# Patient Record
Sex: Female | Born: 1945 | Race: White | Hispanic: No | Marital: Married | Smoking: Former smoker
Health system: Western US, Academic
[De-identification: ages and names within clinical notes are randomized; demographics above are authoritative.]

## PROBLEM LIST (undated history)

## (undated) DIAGNOSIS — M1990 Unspecified osteoarthritis, unspecified site: Secondary | ICD-10-CM

## (undated) DIAGNOSIS — F32A Depression, unspecified: Secondary | ICD-10-CM

## (undated) DIAGNOSIS — D649 Anemia, unspecified: Secondary | ICD-10-CM

## (undated) DIAGNOSIS — K219 Gastro-esophageal reflux disease without esophagitis: Secondary | ICD-10-CM

## (undated) DIAGNOSIS — E7800 Pure hypercholesterolemia, unspecified: Secondary | ICD-10-CM

## (undated) DIAGNOSIS — Z87442 Personal history of urinary calculi: Secondary | ICD-10-CM

## (undated) DIAGNOSIS — J189 Pneumonia, unspecified organism: Secondary | ICD-10-CM

## (undated) DIAGNOSIS — Z860100 Personal history of colon polyps, unspecified: Secondary | ICD-10-CM

## (undated) DIAGNOSIS — I10 Essential (primary) hypertension: Secondary | ICD-10-CM

## (undated) DIAGNOSIS — N390 Urinary tract infection, site not specified: Secondary | ICD-10-CM

## (undated) DIAGNOSIS — H9190 Unspecified hearing loss, unspecified ear: Secondary | ICD-10-CM

## (undated) HISTORY — PX: ABDOMINAL HYSTERECTOMY: SHX81

## (undated) HISTORY — DX: Unspecified osteoarthritis, unspecified site: M19.90

## (undated) HISTORY — DX: Pneumonia, unspecified organism: J18.9

## (undated) HISTORY — DX: Urinary tract infection, site not specified: N39.0

## (undated) HISTORY — DX: Depression, unspecified: F32.A

## (undated) HISTORY — DX: Personal history of colon polyps, unspecified: Z86.0100

## (undated) HISTORY — DX: Anemia, unspecified: D64.9

## (undated) HISTORY — DX: Gastro-esophageal reflux disease without esophagitis: K21.9

## (undated) HISTORY — DX: Personal history of urinary calculi: Z87.442

## (undated) HISTORY — PX: COCHLEAR IMPLANT: SUR684

## (undated) HISTORY — DX: Personal history of colonic polyps: Z86.010

---

## 1977-09-18 HISTORY — PX: ABDOMINAL HYSTERECTOMY: SHX81

## 2005-02-06 ENCOUNTER — Ambulatory Visit

## 2005-02-10 ENCOUNTER — Encounter: Admitting: Audiologist

## 2005-02-16 ENCOUNTER — Ambulatory Visit

## 2005-04-05 ENCOUNTER — Encounter: Admitting: Otolaryngology

## 2007-05-07 ENCOUNTER — Ambulatory Visit

## 2007-06-07 ENCOUNTER — Ambulatory Visit

## 2007-06-07 ENCOUNTER — Ambulatory Visit: Admitting: Audiologist

## 2007-06-11 DIAGNOSIS — H903 Sensorineural hearing loss, bilateral: Secondary | ICD-10-CM | POA: Insufficient documentation

## 2007-06-11 NOTE — Procedures (Signed)
COCHLEAR IMPLANT EVALUATION                              [name] was seen in the clinic today for an audiologic cochlear implant evaluation.  She was seen for cochlear implant evaluation in 2006 and did not meet the audiologic criteria at that time.  Her history is detailed in that report.  She returned today for repeat evaluation because she is having more communication difficulty.        Audiologic evaluation performed By Enedina Finner, M.A. on May 1 of this year showed moderate to profound bilateral sensorineural hearing loss.  There was a slight hearing decrease at 250 Hz. and 4000 Hz. in the right ear relative to her last evaluation.  The left ear was stable.  Pure tone averages were 81 dB for the right ear and 78 dB for the left.  Electroacoustic analysis of her Widex Senso P-38 digital hearing aids showed good function and appropriate settings.  Aided warble tone responses indicated improved acuity with amplification.  The pre-implant test battery revealed 83% word recognition ability on the HINT sentences presented at 70 dB SPL (57 dB HL) in the binaural condition, 77% with the right aid only and 68% with the left aid only.     [name]' results still do not meet the audiologic criteria for cochlear implantation.  I think she might be a good candidate for the hybrid CI/hearing aid device.  I will check on this for her and email her my findings.  We discussed the implant process and I showed her the internal and external equipment.  No follow up appointments were made but I will contact her when I find out more about the time frame for the hybrid.

## 2010-11-26 NOTE — Progress Notes (Addendum)
PATIENT: [name], [name]   MR #: [id number] SEX: F AGE: 65  DATE OF SERVICE: 02/10/2005 DOB: Jul 11, 1946    OTOLARYNGOLOGY CLINIC NOTE    COCHLEAR IMPLANT EVALUATION    [name] was seen in the audiology clinic today for cochlear implant evaluation. She is a 65 year old woman who has had progressive sensorineural hearing loss. 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 She received her first hearing aids in the second grade at age 14. Her speech production is excellent with good intelligibility, but has some characteristics of deaf speech. Her word recognition ability has decreased significantly in the last few years, so she was referred for a cochlear implant evaluation.    Electroacoustic analysis showed Ms. Dowler' Widex Senso P38 hearing aids to be working well. These are very appropriate hearing aids for her degree of loss. Aided warble tone responses indicated excellent improvement from amplification. The only frequencies where she did not have significant improvement were 3000 and 4000 Hz for the right ear. The pre-implant test battery revealed 92% word recognition ability on the HINT sentences presented at 50 dB HL. Speech reading ability was excellent at 98% with amplification and quite poor at 32% without amplification. This indicated that she recieves significant improvement from her hearing aids both in word recognition ability and in speech reading ability.    Ms. Joost' results do not meet the audiologic criteria for cochlear implantation. However, because of the progressive nature of her loss, I feel that at some point a cochlear implant will be an appropriate therapy for her. We discussed the implant process and I showed her the internal and external equipment. I also gave her some patient literature and video tapes to take home. I did not make a followup appointment in the otology clinic because she does not yet meet all the criteria. However, I  have encouraged her to contact me if she desires reevaluation and to see Dr. Dan Humphreys and Mr. Yisroel Ramming if she notices changes in her hearing.        THIS WAS ELECTRONICALLY SIGNED - 02/20/2005 11:27 AM PST BY: Marcelyn Ditty, MA  AUDIOLOGIST  DEPARTMENT OF OTOLARYNGOLOGY                  NF:[id number])    D: 02/17/2005 09:04 AM  T: 02/18/2005 10:51 AM  C#: [id number]    cc:   Fleet Contras, MA, [address], [address], [place], [place] [address]

## 2011-04-26 ENCOUNTER — Ambulatory Visit

## 2011-05-26 ENCOUNTER — Ambulatory Visit: Admitting: Audiologist

## 2011-05-26 ENCOUNTER — Ambulatory Visit

## 2011-05-28 DIAGNOSIS — H903 Sensorineural hearing loss, bilateral: Secondary | ICD-10-CM | POA: Insufficient documentation

## 2011-05-28 NOTE — Procedures (Signed)
COCHLEAR IMPLANT EVALUATION     [name] was seen in the clinic today for another audiologic cochlear implant evaluation. She has seen me for 2 previous cochlear implant evaluations, in 2006 and 2008.  We discussed enrolling in the hybrid study at [facility] but this didn't happen.  She reports some further deterioration in her hearing since the last evaluation. She and her husband are both reporting significantly decreased communication ability, especially in the last year.  She is using the captioned phone and has had her living room wired for use with the tele-coil.  She uses several accessories utilizing the tele-coil.  [name] indicated that her hearing loss might be congenital or may have happened in early childhood. She had [name] at age 36. Her hearing loss was not identified until age 38 and she obtained hearing aids shortly after the identification.    Audiologic evaluation performed on February 28, 2011 showed  moderate to profound sensorineural hearing loss in both ears.  The right ear had slightly poorer hearing in the left. There were no responses in the right ear above 2000 Hz. Pure tone averages were 80 dB for the right ear and 83 dB for the left.  Electroacoustic analysis of [name]' Widex MA-19 power BTE hearing aids showed good function and appropriate settings.  Aided warble tone responses indicated improved acuity with amplification.  The pre-implant test battery revealed 46% word recognition ability on the AZ-Bio sentences presented at 60 dB SPL in the binaural condition, 44% with the right aid only and 19% with the left aid only.  The score for CNC words were 36% correct.  The BKB SIN score was 8.5 dB.        [name]' results now meet the audiologic criteria for cochlear implantation.  The right ear would be a better choice because of patient preference.  We discussed this in some detail because the AZ-Bio score was higher in the right ear than the left.  However, she has never  noticed any advantage to either ear and uses the phone in the left ear.  She gets little information from the phone but reports no ability to use the phone on the right.  This ear has no response in the higher frequencies and the left still has measureable hearing out to 8000 Hz.  The choice of ear will also be dependent on the imaging studies.      We discussed the implant process and I showed [name] and her husband the internal and external components for the Cochlear, AB and Med-El devices.  These systems were discussed in detail.  [name] was told that successful implant use will require time and commitment to device programming and rehabilitation.  She has more residual hearing than some implant patients so she is likely to notice less improvement initially than a patient with no ability to understand speech.  After our discussion it was decided that would be best served with the Cochlear device, primarily because of the ability to set the telecoil to come on automatically.  The processor color should be white and the coil color should be brown. The patient literature and DVD for the Cochlear Freedom implant/N-5 processor were given to [name] to take home.  Follow up appointment will be scheduled with Dr. Ralph Leyden.  She will contact our scheduling coordinator to arrange this.       Report Electronically Signed By:    Name:   Marcelyn Ditty, M.A.  Training Level:  Scientist, research (medical)  Department:  ENT  Phone #:  [phone]

## 2011-06-08 ENCOUNTER — Ambulatory Visit: Admitting: Otolaryngology

## 2011-06-08 VITALS — Temp 98.9°F | Ht 63.5 in | Wt 163.8 lb

## 2011-06-08 NOTE — Nursing Note (Signed)
>>   [facility] HARPER     Thu Jun 08, 2011  2:11 PM  Pt. Roomed, name & DOB verified.  Allergies, meds & pain reviewed. MD.use of Binocular scope set up and clean up done.M.HARPER,Sr.LVN

## 2011-06-08 NOTE — Progress Notes (Signed)
Followup bilateral sensorineural hearing loss.  Meets audiological criteria for cochlear implantation.  Right is the subjectively worse hearing ear so she wants right cochlear implant.    Plan:  Non-contrast CT temporal bones.  NO contrast.  Followup after CT.  White processor, white coil.

## 2011-06-22 ENCOUNTER — Ambulatory Visit

## 2011-06-30 ENCOUNTER — Ambulatory Visit

## 2011-07-07 ENCOUNTER — Ambulatory Visit: Admitting: Specialist

## 2011-07-07 VITALS — Temp 98.7°F | Ht 63.5 in | Wt 161.2 lb

## 2011-07-07 NOTE — Progress Notes (Signed)
ID: 65 y/o female with bilateral SNHL who was seen in consultation for a right sided cochlear implant with Dr. Ralph Leyden.  She was ordered a CT scan of the temporal bones and recommended to follow up afterwards to ensure she is a good surgical candidate.  She presents today in follow up and notes she has been doing well.  Denies any hearing loss or evidence of infections.  So is soo excited and eager for her implant.    Objective:  Gen: WDWN elderly female found sitting in the exam chair in NAD, breathing comfortably on RA  Head: NC/AT  Eyes: PERRL,EOMI  Ears: hearing aids bilaterally, EAC clear, TM intact and mobile to pneumatic otosocpy    CT temporal bones: normal, basal turn of cochlea patent, facial nerve covered    ASSESSMENT AND PLAN:  65 y/o female with bilateral SNHL   -place order for cochlear implant today  -will f/u at preop    DR. Ralph Leyden was the Otolaryngology attending supervising this patient's visit.

## 2011-07-07 NOTE — Nursing Note (Signed)
>>   [facility] HARPER     Fri Jul 07, 2011  2:06 PM  Pt. Roomed, name & DOB verified.  Allergies,Height, Weight, Temp checked, meds & pain reviewed. M.HARPER,Sr.LVN

## 2011-07-08 NOTE — Progress Notes (Signed)
The patient was seen by the resident.  I reviewed and agree with the resident's findings and plan as outlined in the resident's note above.     [name] [name]

## 2011-07-19 ENCOUNTER — Encounter: Payer: Self-pay | Admitting: Otolaryngology

## 2011-08-25 ENCOUNTER — Ambulatory Visit: Admitting: Otolaryngology

## 2011-08-25 ENCOUNTER — Ambulatory Visit

## 2011-08-25 VITALS — BP 142/78 | HR 72 | Temp 98.4°F | Resp 18 | Ht 63.5 in | Wt 160.9 lb

## 2011-08-25 NOTE — Progress Notes (Signed)
Patient roomed, medication reconciled, appropriate vitals obtained. Pre operative information explained in detail regarding NPO stauts, medication, and check in prior to surgery. All questions answered. Patient verbalized understanding. Appropriate patient signatures obtained.Satin Boal [place], Sr. LVN

## 2011-08-25 NOTE — Nursing Note (Signed)
>>   Delphina Cahill, Extern     Fri Aug 25, 2011 10:36 AM  Pre Op EKG performed. Verlon Au Tech ROP Extern

## 2011-08-25 NOTE — Progress Notes (Signed)
PATIENT: [name]   MR #: [id number] SEX: female  AGE: 54yr  DATE OF SERVICE: 08/25/2011 DOB: 1946-05-08    INPATIENT PRE-OPERATIVE HISTORY AND PHYSICAL    CHIEF COMPLAINT: Bilateral SNHL    HPI:    [name] is a 73yr year-old female who comes to clinic today for preoperative history and physical examination prior to their scheduled procedure on 09/02/11, with Dr. Ralph Leyden.  Procedure planned is RIGHT cochlear implant.    Patient was previously seen by Dr. Leilani Able on 07/07/11 and evaluated to have severe bilateral SNHL and meet criteria for cochlear implantation.  Per patient report,  Her symptoms remain unchanged today.     The patient reports feeling in normal state of health without cold/flu-like symptoms.       PAST MEDICAL HISTORY:  No past medical history on file.    PAST SURGICAL HISTORY:   No past surgical history on file.    MEDICATIONS:   Reviewed today, see EMR for full list.    ALLERGIES:   Allergies   Allergen Reactions    Keflex (Cephalexin) Rash    Sulfa(Sulfonamide Antibiotics) Rash       PCP:  Edmonia Caprio    SOCIAL HISTORY:  History     Social History    Marital Status: MARRIED     Spouse Name: N/A     Number of Children: N/A    Years of Education: N/A     Social History Main Topics    Smoking status: Not on file    Smokeless tobacco: Not on file    Alcohol Use: Not on file    Drug Use: Not on file    Sexually Active: Not on file     Other Topics Concern    Not on file     Social History Narrative    No narrative on file       FAMILY HISTORY:  Noncontributory  No family history on file.    REVIEW OF SYSTEMS:    Patient questionnaire was filled out in the office today - please see scanned form for positive findings. Patient with a total score of 1.    PHYSICAL EXAM:    GENERAL APPEARANCE: Patient is a well-nourished, well-developed female appearing stated age in no acute distress.    EYES: Equal, round and reactive to light. Extraocular muscles were  intact bilaterally.    EARS: EACs bilaterally were clear and widely patent with normal appearing TMs.  Hearing aids in place.    NASAL CAVITY: There are no lesions anteriorly. No evidence of rhinorrhea.    ORAL CAVITY:  Patient with good dentition. Mucosa without lesions. Uvula is at midline. No oropharyngeal asymmetry is noticed.     NECK: There is no evidence of lymphadenopathy, soft and supple.    LUNGS: Clear to auscultation bilaterally.    HEART: Regular rate and rhythm with no murmurs auscultated.    ABDOMEN: Soft, non-tender, non-distended. No palpable masses    EXTREMITY: No edema, warm and well-perfused    NEURO: No focal deficits/abnormalities noted.    ASSESSMENT   [name] is a 3yr year-old female with bilateral SNHL - ready for above mentioned scheduled procedure.    PLAN:  We will proceed with procedure as scheduled. Risks of procedure were explained to patient as the following, but not limited to: Pain, bleeding, infection, need for further procedures, risks of anesthesia (stroke, MI, death), damage to facial nerve, loss of hearing, need for removal  of implant.  After alternatives were also discussed, consent was obtained from patient and placed in patient chart today. Patient given all instructions regarding NPO status, where to report, length of surgery planned, when to expect any pathology results, expected post-operative course. Patient expressed understanding to all instructions and had opportunity to have all questions answered.    Patient will not be sent for a pre-anesthesia evaluation.     Dr. Ralph Leyden is the supervising attending for this visit, and was available for consultation throughout.    Electronically signed by:    Mikeal Hawthorne, MD PGY-2  Dept. of Otolaryngology - Head & Neck Surgery  Pager#. [phone]  Service Pager#. [phone]  PI#. [phone]

## 2011-08-25 NOTE — Patient Instructions (Signed)
"Together we pledge compassionate care with exceptional service."    PREOPERATIVE INSTRUCTIONS AND INFORMATION     If there are any changes in your health or questions concerning your admission or surgery, please call      Doctor: Ralph Leyden At: ENT Clinic Phone: [phone]     Date of Surgery: 09/02/11   Post-op Appointment:  09/08/11   Post- op Appointment Time: 9:00       INSTRUCTIONS FOR DAY OF SURGERY/PROCEDURE:  1. Arrival time is 2 hours prior to the scheduled start time, which could be as early as 5:30 a.m.   2. Please follow the Fasting (NPO) Guidelines (see below)  3. We care about your safety.  Please make arrangements to have a responsible adult with you when you leave the hospital. You must also arrange for car transportation (walking, bicycles, taxi and buses are prohibited). Your reflexes and judgment may be impaired for 18-48 hours after your surgery; therefore we cannot allow you to take responsibility for transporting yourself.  You should not drive, use power tools, make important decisions or drink alcohol for at least 24 hours after your operation.  UNFORTUNATELY, YOUR   SURGERY/ PROCEDURE WILL BE CANCELLED IF YOU DO NOT MAKE THESE ARRANGMENTS.   4. DO NOT smoke.  Smoking damages the protective mechanisms in the lung, causes build up of stomach juices, and heightens dangerous airway reflexes.  Stop smoking as far in advance as possible.  Smoking is not allowed on hospital grounds.  5. DO NOT drink alcohol or use illicit drugs 24 hours prior to your surgery.  Inform your surgeon if you use any of these substances.  6. Please take a bath or shower the morning of your procedure, as this is important to reduce skin bacteria.  7. DO NOT apply cosmetics, creams or lotions, powders, gels or perfumes after bathing.  8. DO NOT bring valuables (computers, cell phones, etc.) or jewelry (including your wedding ring) with you on the day of surgery.  Please REMOVE ALL PIERCING  JEWELRY PRIOR TO YOUR ARRIVAL.  9. It is best to wear comfortable, loose fitting clothing.  10. Bring some form of picture ID with you.    ADDITIONAL INSTRUCTIONS:  1. Please bring your CPAP machine with you if you use one.  2. Please follow the bowel prep instructions if this procedure is ordered by your surgeon.  UNFORTUNATELY, YOUR SURGERY WILL BE CANCELLED IF THIS IS NOT DONE.  3.   4.     FASTING (NPO) Guidelines - NOTHING BY MOUTH OR TUBE FEEDING (ENTERAL FEEDING)    FOR ADULTS:   NO solid food, dairy products, jello, broths, chewing gum, hard candy, lozenges or mints 8 hours prior to arrival     On the morning of Surgery/Procedure:  You may have sips of clear liquids up to 2 hours prior to arrival  o  Clear liquids include: water, clear fruit juice (no pulp), carbonated beverages and black coffee or tea without cream or milk, no sugar or sweeteners      No alcohol containing beverages       MEDICATIONS     Do not take NSAIDS (Ibuprofen, Motrin, Advil, Nuprin, Naprosyn), vitamins or herbal supplements 5 days prior to your surgery date without clearance from your surgeon.      Do not take aspirin products 10-14 days prior to your surgery without clearance by your surgeon.      On the morning of your surgery, DO NOT take the following medications:  Potassium Chloride (MICRO-K) 10 mEq capsule      Triamterene 37.5 mg/Hydrochlorothiazide 25mg  (DYAZIDE) 37.5-25 mg per capsule             No outpatient prescriptions have been marked as taking for the 08/25/11 encounter (Office Visit) with Mikeal Hawthorne.       YOUR SURGERY AND CONTACT INFORMATION      [facility], [address], [place], [place] [address]   The final surgery schedule is available 24 hours prior to your scheduled surgery date.  The [facility] Pre-op Coordinator will contact you the day before your surgery between the hours of 12 noon and 6 p.m. (Monday to Friday) with instructions regarding your arrival time, where to check in and  additional information you will need for your surgery.  You may call the [facility] Surgery line at [phone], the day before your surgery if you have not received information.    If you reach our voice mail, please leave your name, your medical record number, your surgery date and a telephone number where we may reach you, and we will return your call.  IF YOU MUST CANCEL YOUR SURGERY OUTSIDE NORMAL BUSINESS HOURS ON THE DAY BEFORE OR THE DAY OF THE SCHEDULED SURGERY, PLEASE CALL THE [facility] LINE AS SOON AS POSSIBLE AT [phone].    THANK YOU FOR CHOOSING [facility].  IT IS OUR PRIVILEGE TO CARE FOR YOU!

## 2011-08-25 NOTE — Progress Notes (Signed)
The patient was seen by the resident.  I reviewed and agree with the resident's findings and plan as outlined in the resident's note above.     [name]

## 2011-09-01 NOTE — Nurse Focus (Signed)
Requested ASL interpreter for preop in PACU2 for 09/02/2011 at 0845/ confirmed by Clydie Braun MIS services. Requested ASL interpreter for postop at approx 1230pm 09/02/11, confirmed by Clydie Braun MIS services.

## 2011-09-02 ENCOUNTER — Ambulatory Visit: Admit: 2011-09-02 | Admitting: Otolaryngology

## 2011-09-02 MED ORDER — ONDANSETRON HCL (PF) 4 MG/2 ML INJECTION SOLUTION
4.0000 mg | INTRAMUSCULAR | Status: DC | PRN
Start: 2011-09-02 — End: 2011-09-02

## 2011-09-02 MED ORDER — REMOVE SCOPOLAMINE PATCH
Freq: Once | Status: DC
Start: 2011-09-05 — End: 2011-09-02

## 2011-09-02 MED ORDER — FENTANYL (PF) 50 MCG/ML INJECTION SOLUTION
25.0000 ug | INTRAMUSCULAR | Status: DC | PRN
Start: 2011-09-02 — End: 2011-09-02

## 2011-09-02 MED ORDER — HYDROMORPHONE (PF) 1 MG/ML INJECTION SYRINGE
0.2000 mg | INJECTION | INTRAMUSCULAR | Status: DC | PRN
Start: 2011-09-02 — End: 2011-09-02

## 2011-09-02 MED ORDER — METOCLOPRAMIDE 5 MG/ML INJECTION SOLUTION
10.0000 mg | INTRAMUSCULAR | Status: DC | PRN
Start: 2011-09-02 — End: 2011-09-02

## 2011-09-02 MED ORDER — HYDROCODONE 5 MG-ACETAMINOPHEN 325 MG TABLET
1.0000 | ORAL_TABLET | ORAL | Status: DC | PRN
Start: 2011-09-02 — End: 2011-09-02

## 2011-09-02 MED ORDER — HYDROCODONE 5 MG-ACETAMINOPHEN 325 MG TABLET
1.0000 | ORAL_TABLET | ORAL | Status: AC | PRN
Start: 2011-09-02 — End: 2011-09-09

## 2011-09-02 MED ORDER — LACTATED RINGERS IV INFUSION
INTRAVENOUS | Status: DC
Start: 2011-09-02 — End: 2011-09-02

## 2011-09-02 MED ORDER — LIDOCAINE HCL 10 MG/ML (1 %) INJECTION SOLUTION
0.1000 mL | INTRAMUSCULAR | Status: DC | PRN
Start: 2011-09-02 — End: 2011-09-02
  Administered 2011-09-02: 0.2 mL

## 2011-09-02 MED ORDER — SCOPOLAMINE 1 MG OVER 3 DAYS TRANSDERMAL PATCH
1.0000 | MEDICATED_PATCH | Freq: Once | TRANSDERMAL | Status: AC
Start: 2011-09-02 — End: 2011-09-02
  Administered 2011-09-02: 1 via TRANSDERMAL
  Filled 2011-09-02: qty 1

## 2011-09-02 MED ORDER — LACTATED RINGERS IV INFUSION
INTRAVENOUS | Status: DC
Start: 2011-09-02 — End: 2011-09-02
  Administered 2011-09-02: 09:00:00 via INTRAVENOUS

## 2011-09-02 NOTE — Progress Notes (Addendum)
ANESTHESIOLOGY PACU NOTE  Date: 09/02/2011 Time: 3:35 PM    Date of Service (Patient contact): 09/02/2011    Patient ID & Procedure/Indication:   [name] is a 65 year old female s/p cochlear implant under general.  Doing very well, no pain. Denies any N/V. No chest pain or SOB.     Temp: 36.6 C (97.9 F) (12/15 1445)  Temp src: Core (12/15 1445)  Pulse: 85  (12/15 1445)  BP: 118/55 mmHg (12/15 1445)  Resp: 22  (12/15 1445)  SpO2: 100 % (12/15 1445)  Height: 162.6 cm (5\' 4" ) (12/15 0842)  Weight: 72.7 kg (160 lb 4.4 oz) (12/15 0842)       Neuro : A/O x 3, MAE X 4   Cardio: Reg rate and rhythm, no murmurs  Lungs: CTAB   Abd: Soft, WNL  Dressings: C/D/I     Okay to D/C from PACU to home.     Report Completed by:   Fransisca Connors  Department of Anesthesiology and Pain Medicine  Resident Physician, PGY-4  Pager#: [phone]  PI#: [phone]    Pt was seen by me in PACU and I agreed to the above plan.  Electronically signed by:  Kinnie Scales. Sheppard Penton, M.D.  Attending Anesthesiologist  P.I. [phone]  Pager [phone]

## 2011-09-02 NOTE — Nurse Focus (Signed)
PACU ADMIT NURSING NOTE    Note Started: 09/02/2011, 2:49 PM     Received patient from OR at 1435 hours via gurney.  Monitor and Alarms on.  Patient sleepy but arousable. Ear patch to right side intact. Adele Schilder

## 2011-09-02 NOTE — Progress Notes (Signed)
ANESTHESIOLOGY ATTENDING NOTE  Date: 09/02/2011 Time: 2:44 PM      Date of service (Patient contact): *09/02/2011    Procedure: Right cochlear implant    Anesthesia: General    Perioperative Presence: CRNA provider, I provided direction.    Concurrency: TWO-FOUR CONCURRENT CASES:  I was physically present for the key portion(s) of the procedure and during other times, was immediately available to return to the procedure.  The key portions of the procedure are documented below.  During the time in which my physical presence was not required, a designated backup teaching anesthesiologist was immediately available.     Name of Backup Anesthesiologist: Regino Schultze, M.D.    Key Portions:  Induction, positioning, emergence and transport to PACU.                              The information contained on this form is true and correct to the best of my knowledge. Further, I understand that if I misrepresent, falsify or conceal information regarding my participation in the professional service described above, I may be subject to fine, imprisonment, or civil penalty under applicable federal laws.

## 2011-09-02 NOTE — Progress Notes (Addendum)
ANESTHESIA PRE OP ASSESSMENT  Date: 09/02/2011 Time: 10:07 AM   Date of Service (Patient contact): 09/02/2011  Patient Name: [name]  14yr  Mar 22, 1946    Consent form completed and signed by the patient: yes   Scheduled Surgery Date: 09/02/2011  Proposed Surgery:  Right Cochlear Implant  Pre-op Dx: Otosclerosis    HISTORY  Patient Active Problem List   Diagnoses Date Noted    Sensorineural hearing loss, bilateral 05/28/2011    Sensory Hearing Loss, Bilateral 06/11/2007    Allergies   Allergen Reactions    Keflex (Cephalexin) Rash    Sulfa(Sulfonamide Antibiotics) Rash      No past medical history on file.  No past surgical history on file. Prescriptions prior to admission   Medication    Estradiol (ESTRACE) 1 mg Tablet    MULTIVITAMIN WITH MINERALS (MULTI-VITAMIN W/MINERALS PO)    Potassium Chloride (MICRO-K) 10 mEq capsule    Pravastatin (PRAVACHOL) 20 mg Tablet    Triamterene 37.5 mg/Hydrochlorothiazide 25mg  (DYAZIDE) 37.5-25 mg per capsule    Vit A,C & E-Niac-B2-Lut-Mn-Glu (EYE-VITE) 1000-300-100-2 unit-mg-unit-mg Tab      Social History     Occupational History    Not on file.     Social History Main Topics    Smoking status: Former Smoker -- 0.5 packs/day for 10 years     Types: Cigarettes     Quit date: 09/01/1982    Smokeless tobacco: Never Used    Alcohol Use: 0.5 oz/week     1 Glasses of wine per week      occasional    Drug Use: No    Sexually Active: Not on file    Active Inpatient Medications     Lactated Ringers, , IV, CONTINUOUS, Last Rate: 20 mL/hr at 09/02/11 0908         Lidocaine (XYLOCAINE) 10 mg/mL (1 %) 0.1-0.25 mL, INFILTRATION, PRN        No family history on file.     Anesthetic Hx:  general anesthesia, denies complications             CV Tests:  EKG    Patient Prescribed a Beta Blocker? No    Labs:  Lab Results   Lab Name Value Date/Time    WHITE BLOOD CELL COUNT 5.1 08/25/2011 0923    HEMOGLOBIN 15.4 08/25/2011 0923    HEMATOCRIT 45.1 08/25/2011 0923    PLATELET COUNT  274 08/25/2011 0923     Lab Results   Lab Name Value Date/Time    SODIUM 136 08/25/2011 0923    POTASSIUM 3.6 08/25/2011 0923    CHLORIDE 101 08/25/2011 0923    CARBON DIOXIDE TOTAL 27 08/25/2011 0923    UREA NITROGEN, BLOOD (BUN) 10 08/25/2011 0923    CREATININE BLOOD 0.56 08/25/2011 0923    GLUCOSE 90 08/25/2011 0923     No results found for this basename: INR, APTT, PT     No results found for this basename: POCPREG, PREGURINE       ROS:  CV:  HTN, hyperlipidemia, Denies C/P, Chest pressure, SOB, DOE, Syncope, Presyncope, Palpitations, Orthopnea, PND  Resp:  None  Neuro: None  Musculoskeletal:  None  Med:  None    Activity:  2 flights stairs, walk>1 mile    VITAL SIGNS:  Vital Signs (Last Recorded):  Temp: 36.8 C (98.2 F) (09/02/11 0842)     Pulse: 74   BP: 149/60 mmHg  Resp: 16   SpO2: 100 %  Height: 162.6 cm (  5\' 4" )  Weight: 72.7 kg (160 lb 4.4 oz)  Body surface area is 1.81 meters squared.  Body mass index is 27.51 kg/(m^2).    PE:  Mallampati image     Mallampati Class:  1  Oral Eval: Mouth opening normal  Neck ROM:  full  Thyroid-mentum distance in fingerbreaths: 3  Lungs:  normal  Card:  regular    ASA Status: 2 - Mild, controlled systemic disease and no functional limitations    NPO Guidelines Met: yes    Consent:  Risks and benefits of General and Regional Anesthesia discussed with patient.  Questions answered and patient wishes to proceed.    Impression and Anesthetic Plan:   GETA    Lurena Nida, CRNA, CRNA     This patient was seen, evaluated, and care plan was developed with the CRNA.  I agree with the assessment and plan as outlined in her note.  ASA II for GA ETT and field avoidance.  Report electronically signed by Grayling Congress, MD, MD. Attending

## 2011-09-02 NOTE — Procedures (Signed)
PATIENTCARYLE, [name]  MR #:  [id number]  DOB:  14-Feb-1946  SEX:  F  AGE:  64  OPERATION DATE:  09/02/2011    INPATIENT OPERATION RECORD    PREOPERATIVE DIAGNOSIS:    Sensorineural deafness.    POSTOPERATIVE DIAGNOSIS:    Sensorineural deafness.    PROCEDURE PERFORMED:    Right cochlear implant, operating microscope, facial nerve monitoring  x 2 hours.    TYPE OF ANESTHESIA:    General.    DVT PROPHYLAXIS:    None.    FINDINGS:    Full insertion of Nucleus Freedom.    SPECIMENS REMOVED:    None.    ESTIMATED BLOOD LOSS:    25 ml.    DRAINS:    None.    FLUIDS:    See Anesthesia record.    COMPLICATIONS:    None.    PROBLEM:    Stable to Recovery.    PROCEDURE IN DETAIL:    The patient was brought to the Operating Room and general anesthesia  was induced.  A time-out was performed to correctly identify the  patient and procedure.  Once all parties were agreement, the patient  was turned 180 degrees away from anesthesiologist.  Facial nerve  monitoring electrodes were placed in the orbicularis oris and  orbicularis oculi.  Tap tests performed to confirm proper placement.  The facial nerve monitor was used for neuromonitoring throughout the  duration of the case without adverse event.  We then prepped and  draped the patient in the usual sterile fashion.    We began with injecting 1% lidocaine with epinephrine into the planned  surgical incision site.  We created a postauricular incision down to  the temporalis fascia.  We then elevated a separate periosteal Palva  flap and retracted this forward.  We brought the operating microscope  into the field and used this for microdissection throughout the  duration of the case.    Using sequential smaller cutting burs, we performed a standard  mastoidectomy.  The horizontal semicircular canal and incus were  identified.  We then used this to help [place] identify the facial recess.  This was opened and we were able to evaluate the middle ear.  We  identified the round window and  anterior superior to this we created  our cochleostomy.  Once we did this, we placed a cotton ball in the  mastoid and turned our attention to creating our well.  This was done  posterosuperior to our mastoidectomy using a large cutting bur.  Once  a sufficient well was created, we turned our attention back to the  mastoid cavity and removed the cotton ball.  We opened the Nucleus  Freedom device and placed it in the well.  The ground was placed in  the temporalis muscle.  We then inserted the electrode into the  cochleostomy and achieved full insertion.  A small piece of fascia was  taken to cover our cochleostomy opening.    We then proceeded with closure.  The periosteum was reapproximated to  fully cover the device using interrupted 3-0 Vicryl suture.  The  subcutaneous tissue was then also closed using interrupted 3-0 Vicryl  suture.  Steri-Strips were placed over the incision and a Glasscock  dressing was applied.  We then turned the patient over to Anesthesia  for recovery.    POSTOPERATIVE PLAN:    The patient will be discharged home with pain medication.  She will  follow up  in one week for a wound check.    NAME OF SURGEONS AND ASSISTANTS:    Surgeon:  Aquilla Hacker, MD, PhD  Assistant Surgeon:  Arletta Bale, MD        Report Electronically Signed - 09/03/2011 13:03:02 by  Arletta Bale, MD  Resident  Otolaryngology    Report Electronically Signed - 09/05/2011 07:56:30 by  Aquilla Hacker, MD, PhD  Professor  Otolaryngology    [facility]  D:  09/02/2011 19:08:26 PDT/PST  T:  09/02/2011 22:40:06 PDT/PST  Job #:  [id number] / [id number]

## 2011-09-02 NOTE — Patient Instructions (Addendum)
MEDICATION:  1. Take your pain medication as needed.    ACTIVITY:  1. Resume regular activity.  However, avoid strenuous activity or lifting objects greater than 10 pounds.    EAR:  1. You can take off hard plastic dressing and remove the cotton ball from your ear after 1 day.  2. Do not get your ear wet.    FOLLOW-UP:  1. Call our clinic to make your follow up appointments.  You should make one at 1 week after discharge, and another at 1 month after discharge.  Call [phone] as soon as you can to make your appointment.      General Anesthesia -  After surgery, things you need to know:    1. It is normal to feel dizzy and sleepy for many hours after your surgery.  Do not drive a car, work any equipment, sign important papers, or make big decisions until the next day.  2. Diet:  Begin with clear liquids (apple juice, clear broth, ginger ale) and progress slowly to what you normally eat.  No alcoholic drinks for 24 hours.  3. Activities:  Rest the day of surgery.  The day after surgery , you may do whatever you feel able to do, unless otherwise directed by your physician.  You should be able to do all of your normal activities within 3-4 days.  4. Temperature:  Report any FEVER over 100 degrees to your doctor.  Take your temperature once the evening of the day of surgery, and once the next morning.      For Surgical problems, report to ENT at  [phone].  If you are unable to access services, call the [facility] Operator at [phone] and ask for the Physician on call for: ENT

## 2011-09-02 NOTE — Plan of Care (Signed)
Problem: Perioperative Period (Adult, Obstetrics)  Goal: Prevent/Manage Potential Problems (Perioperative Period (Adult, Obstetrics))  Outcome: Signs and symptoms of listed potential problems will be absent or manageable (reference CPG)   Outcome: Adequate for discharge Date Met:  09/02/11  Discharge to home via wheelchair to private auto at Date: 09-02-11 at Time: 1435.  Belongings with patient.  See EMR for assessment.  [name]

## 2011-09-02 NOTE — Procedures (Signed)
ATTENDING BRIEF OPERATIVE NOTE  Service ENT  09/02/2011, 2:33 PM    Date of Service: 09/02/11    Pre-Op Diagnosis:  Sensorineural deafness    Post-Op Diagnosis:  same    Procedure Performed/Description:  Right cochlear implant, operating microscope, facial nerve monitor    Name of Surgeon and Assistants:  Cindy Hazy    Type of Anesthesia:  gen    DVT Prophylaxis: none    Findings:  Full insertion NucleusFreedom    Specimens Removed:  none    EBL:  25 cc    Drains:  none    Fluids:  yes    Complications:  none    Outcome: should do well    Potential Modifiers: Any of the Following Modifiers Apply To This Case?  -22 Unusual procedural service (must document what made it unusual)  no  -62 Co-Surgery (both surgeons must separately document their portions no  -80 Designer, television/film set (faculty assist - non Medicare patient) no  Chartered loss adjuster (faculty assist- Medicare patient, no qualified residents available) no  Are there any other factors about the case that are important for correct coding?  no    Operating Room Procedure Presence: I was physically present for the entire procedure.    The information contained on this form is true and accurate to the best of my knowledge.  Further, I understand that if I misrepresent, falsify or conceal information regarding my participation in the professional service described above, I may be subject to fine, imprisonment, or civil penalty under applicable federal laws.     Aquilla Hacker, MD, MD Attending Physician

## 2011-09-02 NOTE — Progress Notes (Addendum)
ANESTHESIOLOGY OPERATIVE NOTE  Date: 09/02/2011 Time: 10:11 AM    Date of Service (Patient contact): 09/02/2011    Procedure: Right Cochlear Implant    Anesthesia: General    Estimated Blood Loss: <20    Intravenous Fluids: Crystalloid 1500 ml    Urine output: n/m  Anesthetic Complications: No apparent anesthetic complications  Disposition: To PACU, 100% O2 via FM, Awake, Resting comfortably, VSS, Report to RN  Rebecca L. Dara Lords, CRNA  Sr. Nurse Anesthetist  PI# [phone] Pager [phone]    I concur with the above.  Electronically signed by:  Kinnie Scales. Sheppard Penton, M.D.  Attending Anesthesiologist  P.I. [phone]  Pager [phone]

## 2011-09-08 ENCOUNTER — Ambulatory Visit: Admitting: Nurse Practitioner

## 2011-09-08 VITALS — Temp 97.3°F | Ht 63.5 in | Wt 160.6 lb

## 2011-09-08 NOTE — Progress Notes (Signed)
Otolaryngology Head and Neck Surgery Clinic Note    CC:  [name] is a 65yr old female presenting s/p right cochlear implant with Dr. Ralph Leyden on 09/02/11       Subjective:  HISTORY OF PRESENT ILLNESS:  [name] is a 65yr old female with a history of bilateral SNHL who was recommended a right sided cochlear implant with Dr. Ralph Leyden which was performed on 09/02/11. She presents, POD#6 for evaluation. She is feeling well and denies dizziness. She notes some pressure to her right ear, but denies overt pain. She has been keeping her ear dry.       Objective:   PHYSICAL EXAMINATION:  Temp(Src) 36.3 C (97.3 F) (Tympanic)  Ht 1.613 m (5' 3.5")  Wt 72.848 kg (160 lb 9.6 oz)  BMI 28.00 kg/m2    General: No acute distress or pain; Alert & oriented to time, place and person. Affect appropriate to mood.   Head: Atraumatic/Normocephalic  Ears:  Right: Normal pinna with healing postauricular incision, steristrips removed, site gently cleansed free of crusting. 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 No dizziness, no pain. Postauricular incision healing well.   Plan:     -Follow up in 1 month for Cochlear implant activation and evaluation by Dr. Ralph Leyden      Dr. Abbe Amsterdam  was the supervising attending in clinic today and was available for consultation for the entirety of my encounter with this patient      Kris Hartmann, ANP-BC  PI# [id number]  [facility]  Department of Otolaryngology  Pager: [phone]

## 2011-09-08 NOTE — Progress Notes (Signed)
Identified patient X2. Weight measured. Height measured. Temp measured. Screened for pain. Allergies reviewed. Medications reviewed by MD. Barriers assessed.         All functions performed under the supervision of an LVN or physician.    Charlis Harner, OROSU OR Intern

## 2011-09-15 NOTE — Progress Notes (Signed)
I did not see the above stated patient but I agree with the residents/clinical practice nurse/or nurse practioners assessment and plan.    Brandon Hopkins, MD  Pediatric Otolaryngology Fellow  [email or web address]  Pager:  [phone]  Cell:  [phone]

## 2011-09-19 HISTORY — PX: COCHLEAR IMPLANT: SUR684

## 2011-09-20 MED FILL — lactated Ringers intravenous solution: INTRAVENOUS | Qty: 1000 | Status: AC

## 2011-09-20 MED FILL — bacitracin 50,000 unit intramuscular solution: INTRAMUSCULAR | Qty: 1 | Status: AC

## 2011-09-20 MED FILL — sodium chloride 0.9 % intravenous solution: INTRAVENOUS | Qty: 1000 | Status: AC

## 2011-09-20 MED FILL — sodium chloride 0.9 % irrigation solution: Qty: 2000 | Status: AC

## 2011-09-20 MED FILL — sodium chloride 0.9 % irrigation solution: Qty: 500 | Status: AC

## 2011-09-20 MED FILL — lidocaine 1 %-epinephrine 1:100,000 injection solution: INTRAMUSCULAR | Qty: 20 | Status: AC

## 2011-09-21 ENCOUNTER — Ambulatory Visit

## 2011-10-06 ENCOUNTER — Ambulatory Visit: Admitting: Specialist

## 2011-10-06 ENCOUNTER — Ambulatory Visit: Admitting: Audiologist

## 2011-10-06 NOTE — Progress Notes (Signed)
The patient was seen by the resident.  I reviewed and agree with the resident's findings and plan as outlined in the resident's note above.     Karen Jo Doyle

## 2011-10-06 NOTE — Nursing Note (Signed)
>>   [facility] HARPER     Fri Oct 06, 2011  2:28 PM  Pt. Roomed, name & DOB verified.  Allergies,Height, Weight, Temp checked, meds & pain reviewed. Hearing difficulty. Pt.hard of hearing assisted with hearing device. M.Harper,Sr.LVN

## 2011-10-06 NOTE — Progress Notes (Signed)
Otolaryngology Head and Neck Surgery Clinic Note    CC:  [name] is a 66yr old female presenting s/p right cochlear implant with Dr. Ralph Leyden on 09/02/11       Subjective:  HISTORY OF PRESENT ILLNESS:  [name] is a 66yr old female with a history of bilateral SNHL who was recommended a right sided cochlear implant with Dr. Ralph Leyden which was performed on 09/02/11. She's doing very well. No fevers/chills, no pain or bleeding. Has already had implant activated. No infections      Objective:   PHYSICAL EXAMINATION:  Temp(Src) 36 C (96.8 F) (Tympanic)  Ht 1.6 m (5\' 3" )  Wt 73.3 kg (161 lb 9.6 oz)  BMI 28.63 kg/m2    General: No acute distress or pain; Alert & oriented to time, place and person. Affect appropriate to mood.   Head: Atraumatic/Normocephalic  Ears:  Right: Normal pinna with well healed. External auditory canal aerated, clear; Tympanic membrane translucent and intact without evidence of effusion, perforation, or retraction      ASSESSMENT/PLAN: 66 y.o F with bilateral SNHL one month s/p right cochlear implant, doing well.   - Wound healed, no residual symptoms following surgery  - F/u with Leeroy Cha for ongoing CI programming  - F/u ENT as needed.    Dr. Ralph Leyden  was the supervising attending in clinic today and was available for consultation for the entirety of my encounter with this patient    Orlean Bradford, MD  Otolaryngology - Head and Neck Surgery

## 2011-10-09 NOTE — Procedures (Signed)
[name] was seen in the office today for initial activation of her Nucleus N-5 cochlear implant system.  Her post-operative recovery was uneventful.  She had no significant pain or tinnitus. She reported to very brief episodes of vertigo.  Implant tests indicated normal performance for all 22 intracochlear and the 2 extracochlear electrodes.      A 900 Hz/channel 8 maxima map was created to obtain the initial threshold and comfort levels.  She had an auditory percept for the apical two thirds of the electrodes. However, she described a higher frequency stimulation as more of a feeling than a hearing sensation.  Initial threshold responses required fairly high current levels.  Several maps were created from the new levels.  Modifications to the maps were made using the map tools.  Progressive maps were loaded into the processor.  They are summarized in the session details in the cochlear implant file.     Use and care of the processor, remote assistant and accessories were discussed.  The backup processor was also mapped.     [name] will return to the office in 2 to 3 weeks.  We will definitely want to re-measure threshold responses to see if less current is necessary.  She will contact me if there are any questions or concerns about the implant.       Report Electronically Signed By:    Name:   Marcelyn Ditty, M.A.  Training Level:  Scientist, research (medical)  Department:  ENT  Phone #:  [phone]

## 2011-10-19 ENCOUNTER — Ambulatory Visit: Admitting: Otolaryngology

## 2011-10-19 ENCOUNTER — Ambulatory Visit: Admitting: Audiologist

## 2011-10-27 ENCOUNTER — Ambulatory Visit

## 2011-10-27 ENCOUNTER — Ambulatory Visit: Admitting: Audiologist

## 2011-10-28 NOTE — Procedures (Signed)
[name] was seen in the office today for cochlear implant programming.  This was her first recheck appointment since implant activation.  She has been disappointed with her ability to understand speech so we again discussed the fact that this is a process.  Because she had loss as a child, it will likely take a long time to maximize her implant performance.  She did report improved ability to hear environmental sounds.       A cochlear implant impedance test was performed and all electrode channels were working properly.  Impedances were very stable across the electrode array. New threshold and comfort levels were established.  [name] reported a hearing sensation for the low frequency and mid-range electrode channels but at about electrode 8 she described the sensation as more of a feeling.  In adition, much more current was required for the higher frequencies.  Her thresholds were high overall so I decided to create a new map with a 37 millisecond pulse width.  This allowed lower threshold and comfort levels and I was able to keep all channels in voltage compliance.  I also collected NRT responses for 5 channels.  Responses were present at fairly high current levels.  The Cochlear/[employer] screening test was administered.  [name] met criterion for the first 3 sections but did not demonstrate open set word recognition ability.  Four maps were loaded into the speech processor.  They were all variations of map 11 and are summarized in the session details in the cochlear implant mapping software.  The backup processor was also mapped.       [name] will return to the office in 4 to 6 weeks.  I asked her to use the implant in isolation without her contralateral hearing aid for part of every day.  She should also try the Sound and [employer] program I gave her at activation.  She will contact me sooner if there are any questions or concerns about the implant.       Report Electronically  Signed By:    Name:   Marcelyn Ditty, M.A.  Training Level:  Scientist, research (medical)  Department:  ENT  Phone #:  [phone]

## 2011-11-24 ENCOUNTER — Ambulatory Visit: Payer: PRIVATE HEALTH INSURANCE

## 2011-11-24 ENCOUNTER — Ambulatory Visit: Admitting: Audiologist

## 2011-11-25 NOTE — Procedures (Signed)
[name] was seen in the office today for cochlear implant programming.  She is very disappointed with the performance of her cochlear implant up to this point.  She feels as though her communication ability has regressed and she is starting to withdraw from social situations.  She reports that the hearing aid and cochlear implant seem to compete with each other.  She has practiced with the Sound and [employer] dilligently.  She is doing better with the environmental sounds exercises but still struggles with speech perception.  She is training her right ear by using the cochlear implant alone for part of each day.         A cochlear implant impedance test was performed and all electrode channels were working properly.  Impedances incrased for electrodes 1 and 2 because they have been de-activated secondary to a lack of auditory perception.    Sound field testing was performed before re-mapping today.  Thresholds were 30 to 45 dB across the test frequencies.    We re-connected with the mapping computer and a sweep was performed across the channels. [name] had an auditory percept for all channels.   The map she has been using since last time has a narrow dynamic range.  On her previous visits, she had more of a 'feeling sensation' for about half of the electrode channels.  Electrodes 1 and 2 were re-activated.  New thresholds were established.  All measured thresholds were significantly reduced compared with the last mapping session.  In addition, the C levels were higher than before, giving [place] a much better dynamic range.  We created 37 ms. and 25 ms. pulse width maps.  She had a preference for the 37 ms. map.  It had less background noise.  She indicated that my voice sounded 'robotic' but intelligibility was better.  Sound field testing was repeated for map 15. Threshold responses were improved by 10 to 15 dB.     4 versions of map 15 were loaded into [name]' sound processor.  They are  summarized in the session details in the cochlear implant mapping software.  The backup processor was also mapped.       [name] will return to the office in one month.  I will put her in touch with some other implant users so she has a better idea of how abilities typically develop when learning to use a cochlear implant.  She will contact me sooner if there are any questions or concerns about the implant.       Report Electronically Signed By:    Name:   Marcelyn Ditty, M.A.  Training Level:  Scientist, research (medical)  Department:  ENT  Phone #:  [phone]

## 2011-12-28 ENCOUNTER — Ambulatory Visit: Payer: PRIVATE HEALTH INSURANCE

## 2011-12-28 ENCOUNTER — Ambulatory Visit: Admitting: Audiologist

## 2011-12-30 NOTE — Procedures (Signed)
[name] was seen in the office today for cochlear implant programming.  She did well for about weeks after our last visit but started experiencing a constant high frequency sound from her implanted ear. This has been present whether she was wearing her processor or not but is louder when she is using her implant.  She has also had some problems with her remote assistant.  It has not paired successfully with her backup processor.          I tried to get her remote to pair and also had some difficulty.  I will request a warranty repair for remote assistant number [id number].  A cochlear implant impedance test was performed and all electrode channels were working properly.  I decided to try a a slower stimulation rate and experiment with the maxima numbers to see if this would give her better sound quality with lower current levels.  I also decided to reduce her high frequency current levels, even if the high frequency sounds are slightly less audible.  New threshold and comfort levels were established and alwectrode balancing was performed at C and 70% of her dynamic range.  Several maps were created from the new levels.  Modifications to the maps were made using the map tools.  She reported better sound quality and comfort.  I also turned off the 6 most high frequency electrodes to see whether this reduced her tinnitus but it had no effect.  These channels were re-activated.   Four maps were loaded into the speech processor.  We decided to do 2 500 Hz. and 2 900 Hz. maps.  They are summarized in the session details in the cochlear implant mapping software.  The backup processor was also mapped.       [name] will return to the office in 4 to 6 weeks.  I will order her a new remote control. She will contact me sooner if there are any questions or concerns about the implant.       Report Electronically Signed By:    Name:   Marcelyn Ditty, M.A.  Training Level:  Health and safety inspector  Department:  ENT  Phone #:  [phone]

## 2012-01-17 ENCOUNTER — Ambulatory Visit: Payer: PRIVATE HEALTH INSURANCE

## 2012-01-19 ENCOUNTER — Ambulatory Visit: Admitting: Audiologist

## 2012-01-25 NOTE — Procedures (Signed)
Jenia Klepper was seen in the office today for cochlear implant programming.  She still reports a humming sound from her implanted ear.  I think this is tinnitus because it is present even when her processor is off.  However, this sound increases when she uses her processor.  Her speech understanding with the implant is poor and she reports the sound quality as echoic and 'crackly'.  She can't discriminate between female and female voices.     A cochlear implant impedance test was performed and all electrode channels were working properly.  I decided to create a 1200 Hz. map.  New threshold and comfort levels were established using a 900 Hz. map as a basis.  Sound field testing with the new map (25) showed good acuity but there was a threshold drop off in the higher frequencies.  This is likely because of our reduction in high frequency current levels over time because of sound quality complaints.  I creatred another 1200 Hz. map to try with some current increases above 3000 Hz.  Four maps were loaded into the speech processor.  They are summarized in the session details in the cochlear implant mapping software.  The backup processor was also mapped.       Ms. Lopp will return to the office in 2 weeks.  She will contact me sooner if there are any questions or concerns about the implant.       Report Electronically Signed By:    Name:   Marcelyn Ditty, M.A.  Training Level:  Scientist, research (medical)  Department:  ENT  Phone #:  [phone]

## 2012-02-02 ENCOUNTER — Ambulatory Visit: Admitting: Audiologist

## 2012-02-02 ENCOUNTER — Ambulatory Visit: Payer: PRIVATE HEALTH INSURANCE | Admitting: Audiologist

## 2012-02-04 NOTE — Procedures (Signed)
Evola Burkley was seen in the office today for cochlear implant programming.  She reports hearing better with her implant for about a week before noticing a sudden decrease in performance.  She reported the implant now sounds distorted and detracts from her ability to use her hearing aid successfully.  She indicated that the sound from the implant seems too loud and distorted.  From her report it sounds like Ms. [name]' current levels may be too high.        A cochlear implant impedance test was performed and all electrode channels were working properly.  The previously-obtained NRT responses were checked and found to exhibit good wave quality for the tested channels.  New threshold and comfort levels were established.  We wound up decreasing the current  levels to allow access to soft sound but less dirtorted sound quality.        Two new maps were loaded into locations 3 and 4.  The backup processor was also mapped.       Ms. Markwood will return to the office in about one month.  She will contact me sooner if there are any questions or concerns about the implant.       Report Electronically Signed By:    Name:   Marcelyn Ditty, M.A.  Training Level:  Scientist, research (medical)  Department:  ENT  Phone #:  [phone]

## 2012-03-01 ENCOUNTER — Ambulatory Visit: Payer: PRIVATE HEALTH INSURANCE

## 2012-03-01 ENCOUNTER — Ambulatory Visit: Admitting: Audiologist

## 2012-03-04 NOTE — Procedures (Signed)
[name] was seen in the office today for cochlear implant programming. She reports that the CI signal is still not compatible with the hearing aid program.  She reports constant noise from the implanted ear.  This is partially a tinnitus phenomenon because it is present without her processor but it is 'much worse' when she is wearing the implant.  In addition to the constant buzzing she reports an 'echoic' sound quality.     A cochlear implant impedance test was performed and all electrode channels were working properly.  I decided to try a slower stimulation rate (500 Hz.) and lower current levels overall.  New threshold and comfort levels were established. [name] reported better sound quality with the 500 Hz. map.  When used without the hearing aid intelligibility was better, even though the volume was a little too soft.  I did not increase the current levels because she always uses the implant with the contralateral hearing aid. Several maps were created from the new levels.  Four maps were loaded into the speech processor.  They are summarized in the session details in the cochlear implant mapping software.  The backup processor was also mapped.       [name] will return to the office in 2 months.  She will contact me sooner if there are any questions or concerns about the implant.       Report Electronically Signed By:    Name:   [name], M.A.  Training Level:  Scientist, research (medical)  Department:  ENT  Phone #:  [phone]

## 2012-03-29 ENCOUNTER — Ambulatory Visit: Payer: PRIVATE HEALTH INSURANCE

## 2012-03-29 ENCOUNTER — Ambulatory Visit: Admitting: Otolaryngology

## 2012-03-29 ENCOUNTER — Telehealth: Payer: Self-pay | Admitting: Otolaryngology

## 2012-03-29 ENCOUNTER — Encounter: Payer: Self-pay | Admitting: Otolaryngology

## 2012-03-29 ENCOUNTER — Ambulatory Visit

## 2012-03-29 ENCOUNTER — Ambulatory Visit: Admitting: OTOLARYNGOLOGY

## 2012-03-29 VITALS — BP 138/83 | HR 73 | Temp 97.1°F | Ht 63.0 in | Wt 162.7 lb

## 2012-03-29 VITALS — BP 138/83 | HR 73 | Temp 97.1°F | Ht 63.0 in | Wt 172.6 lb

## 2012-03-29 DIAGNOSIS — T8131XA Disruption of external operation (surgical) wound, not elsewhere classified, initial encounter: Secondary | ICD-10-CM | POA: Insufficient documentation

## 2012-03-29 NOTE — Progress Notes (Signed)
Constant buzzing tinnitus right ear since three months after the implant.  Implant not helpful to the hearing.  Feels that the hearing aid doesn't mix well with the cochlear implant hearing.    Irritation from right implant behind ear for months.    Exam:  Right implant extruding from postauricular incision - device visible.  No sign of infection but is extruding.    Imp:  Extruding right cochlear implant not helping speech.    Plan:  Surgical removal, replacement with new implant - try to drill well to seat implant more posteriorly.

## 2012-03-29 NOTE — Nursing Note (Signed)
>>   [name]     Fri Mar 29, 2012  1:42 PM  Pre-op EKG performed per physician order. Judi Cong, MAI

## 2012-03-29 NOTE — Progress Notes (Signed)
PAT[facility]: [name]   MR #: [id number] SEX: female  AGE: 58yr  DATE OF SERVICE: 03/29/2012 DOB: [birth year]    INPATIENT PRE-OPERATIVE HISTORY AND PHYSICAL    CHIEF COMPLAINT: right postauricular wound    HPI:    [name] is a 31yr year-old female who comes to clinic today for preoperative history and physical examination prior to their scheduled procedure on 04/06/12, with Dr. Ralph Leyden.  Procedure planned is right cochlear implant revision.    Patient was previously seen by Dr. Ralph Leyden on 03/29/12 and evaluated to have extrusion of the right cochlear implant.  The patient reports that the wound is bothered by the temple of her glasses, but is otherwise asymptomatic.     The patient reports feeling in normal state of health without cold/flu-like symptoms.       PAST MEDICAL HISTORY:  Bilateral sensorineural hearing loss  Hypertension  Hyperlipidemia    PAST SURGICAL HISTORY:   Right cochlear implant    MEDICATIONS:   Reviewed today, see EMR for full list.    ALLERGIES:   Allergies   Allergen Reactions    Keflex (Cephalexin) Rash    Sulfa (Sulfonamide Antibiotics) Rash       PCP:  Edmonia Caprio    SOCIAL HISTORY:  History     Social History    Marital Status: MARRIED     Spouse Name: N/A     Number of Children: N/A    Years of Education: N/A     Social History Main Topics    Smoking status: Former Smoker -- 0.50 packs/day for 10 years     Types: Cigarettes     Quit date: 09/01/1982    Smokeless tobacco: Never Used    Alcohol Use: 0.5 oz/week     1 Glasses of wine per week      occasional    Drug Use: No    Sexually Active: Not on file     Other Topics Concern    Not on file     Social History Narrative    No narrative on file       FAMILY HISTORY:  Noncontributory    REVIEW OF SYSTEMS:    Patient questionnaire was filled out in the office today - please see scanned form for positive findings. Patient with a total score of 1 (occasional heartburn at night).    PHYSICAL  EXAM:    GENERAL APPEARANCE: Patient is a well-nourished, well-developed female appearing stated age in no acute distress.    EYES: Equal, round and reactive to light. Extraocular muscles were intact bilaterally.    EARS: 1 x 2 cm wound behind right pinna, no active drainage. EACs bilaterally were clear with normal appearing TMs.    NASAL CAVITY: There are no lesions anteriorly. No evidence of rhinorrhea.    ORAL CAVITY:  Mucosa without lesions. Uvula is at midline. No oropharyngeal asymmetry is noticed.     NECK: There is no evidence of lymphadenopathy, soft and supple.    LUNGS: Clear to auscultation bilaterally.    HEART: Regular rate and rhythm with no murmurs auscultated.    NEURO: No focal deficits/abnormalities noted. CN VII intact and symmetric.    ASSESSMENT   [name] is a 30yr year-old female with right cochlear implant extrusion - ready for above mentioned scheduled procedure.    PLAN:  We will proceed with procedure as scheduled. Risks of procedure were explained to patient as the following, but not limited to:  Pain, bleeding, infection, need for further procedures, risks of anesthesia (stroke, MI, death), loss of hearing, perforation, vertigo, facial nerve injury, injury to ossicles, CSF leak, brain injury, change of taste. After alternatives were also discussed, consent was obtained from patient and placed in patient chart today. Patient given all instructions regarding NPO status, where to report, length of surgery planned, when to expect any pathology results, expected post-operative course. Patient expressed understanding to all instructions and had opportunity to have all questions answered.    Patient will undergo preoperative evaluation with CXR, EKG, and lab studies (CBC, BMP, PTT/INR).    Dr. Ralph Leyden is the supervising attending for this visit, and was available for consultation throughout.    Electronically Signed By:  Georgeanna Harrison, MD  Otolaryngology - PGY2  PI#  [phone]  Personal Pager [phone]  Service Pager [phone]

## 2012-03-29 NOTE — Telephone Encounter (Signed)
FYI:  Returned patient's call with identification verified x 3.  Patient states, through husband Jonny Ruiz, at Right operative site redness and edema was noted for approx 7 months.  At this time patient states skin has apparently worn down with cochlear implant visible.  Patient denies fever, blood, or pain, however noting tan discharge.  Patient requesting evaluation.  Appointment offered and accepted for today at 1100.  Lavon Paganini, RN

## 2012-03-29 NOTE — Telephone Encounter (Signed)
Telephone call to patient--verified name, DOB, MRN and/or address at initiation of call.    Pt had cochlear surgery in dec 2012. Pt is experiencing a red swollen in the surgical area. Its been 7 months later. Please contact asap

## 2012-03-29 NOTE — Patient Instructions (Addendum)
"Together we pledge compassionate care with exceptional service."    PREOPERATIVE INSTRUCTIONS AND INFORMATION     If there are any changes in your health or questions concerning your admission or surgery, please call      Doctor: Ralph Leyden [facility]: [facility] Phone: [phone]  Surgery Desk Option   Date of Surgery: 04/06/12   Post-op Appointment:  04/12/12   Post- op Appointment Time: 10:15am       INSTRUCTIONS FOR DAY OF SURGERY/PROCEDURE:  Arrival time is 2 hours prior to the scheduled start time, which could be as early as 5:30 a.m.   Please follow the Fasting (NPO) Guidelines (see below)  We care about your safety.  Please make arrangements to have a responsible adult with you when you leave the hospital. You must also arrange for car transportation (walking, bicycles, taxi and buses are prohibited). Your reflexes and judgment may be impaired for 18-48 hours after your surgery; therefore we cannot allow you to take responsibility for transporting yourself.  You should not drive, use power tools, make important decisions or drink alcohol for at least 24 hours after your operation.  UNFORTUNATELY, YOUR SURGERY/ PROCEDURE WILL BE CANCELLED IF YOU DO NOT MAKE THESE ARRANGMENTS.   DO NOT smoke.  Smoking damages the protective mechanisms in the lung, causes build up of stomach juices, and heightens dangerous airway reflexes.  Stop smoking as far in advance as possible.  Smoking is not allowed on hospital grounds.  DO NOT drink alcohol or use illicit drugs 24 hours prior to your surgery.  Inform your surgeon if you use any of these substances.  Please take a bath or shower the morning of your procedure, as this is important to reduce skin bacteria.  DO NOT apply cosmetics, creams or lotions, powders, gels or perfumes after bathing.  DO NOT bring valuables (computers, cell phones, etc.) or jewelry (including your wedding ring) with you on the day of surgery.  Please REMOVE ALL PIERCING JEWELRY PRIOR TO YOUR ARRIVAL.  It is  best to wear comfortable, loose fitting clothing.  Bring some form of picture ID with you.      ADDITIONAL INSTRUCTIONS:  1. Please bring your CPAP machine with you if you use one.  All necessary Pre-op  Instruction given to Pt./ family member. If needed instruction to complete all ancillary orders as per directions given, Lab work, Radiology for chest x-ray, EKG, with understanding noted.      YOUR SURGERY AND CONTACT INFORMATION      [facility], [address], [place], [place] [address]   The final surgery schedule is available 24 hours prior to your scheduled surgery date.  The [facility] Pre-op Coordinator will contact you the day before your surgery between the hours of 12 noon and 6 p.m. (Monday to Friday) with instructions regarding your arrival time, where to check in and additional information you will need for your surgery.  You may call the [facility] Surgery line at [phone], the day before your surgery if you have not received information by 04:00pm.    If you reach our voice mail, please leave your name, your medical record number, your surgery date and a telephone number where we may reach you, and we will return your call.  IF YOU MUST CANCEL YOUR SURGERY OUTSIDE NORMAL BUSINESS HOURS ON THE DAY BEFORE OR THE DAY OF THE SCHEDULED SURGERY, PLEASE CALL THE [place] SURGERY LINE AS SOON AS POSSIBLE AT [phone].  FASTING (NPO) Guidelines - NOTHING BY MOUTH OR TUBE FEEDING (ENTERAL FEEDING)    FOR ADULTS:   NO solid food, dairy products, jello, broths, chewing gum, hard candy, lozenges or mints 8 hours prior to arrival     On the morning of Surgery/Procedure:  You may have sips of clear liquids up to 2 hours prior to arrival  o  Clear liquids include: water, clear fruit juice (no pulp), carbonated beverages and black coffee or tea without cream or milk, no sugar or sweeteners      No alcohol containing beverages       MEDICATIONS     Do not take NSAIDS  (Ibuprofen, Motrin, Advil, Nuprin, Naprosyn), vitamins or herbal supplements 5 days prior to your surgery date without clearance from your surgeon.      Do not take aspirin products 10-14 days prior to your surgery without clearance by your surgeon       No medications AM of surgery.   TYLENOL is  OKAY.      THANK YOU FOR CHOOSING [facility].  IT IS OUR PRIVILEGE TO CARE FOR YOU!

## 2012-03-29 NOTE — Nursing Note (Signed)
>>   Cam Hai, [place]     Fri Mar 29, 2012 12:28 PM   Pt. Roomed, name & DOB verified.  Allergies,Height, Weight, Temp checked, meds & pain reviewed. Hearing difficulty. Pt.hard of hearing assisted with hearing device. All necessary Pre-op  Instruction given to Pt./ family member. If needed instruction to complete all ancillary orders as per directions given, Lab work, Radiology for chest x-ray, EKG, with understanding noted. M.HARPER,Sr.LVN

## 2012-03-29 NOTE — Nursing Note (Signed)
>>   Cam Hai, [place]     [name] Mar 29, 2012 12:27 PM  Pt. Roomed, name & DOB verified.  Allergies,Height, Weight, Temp checked, meds & pain reviewed. Hearing difficulty. Pt.hard of hearing assisted with hearing device. M.HARPER,Sr.LVN

## 2012-04-05 NOTE — Progress Notes (Signed)
The patient was seen by the resident.  I reviewed and agree with the resident's findings and plan as outlined in the resident's note above.     [name]

## 2012-04-05 NOTE — Progress Notes (Addendum)
ANESTHESIA PRE OP ASSESSMENT  Date: 04/05/2012 Time: 10:00   Date of Service (Patient contact): 04/06/2012  Patient Name: [name]  32yr  10/24/1945    Consent form completed and signed by the patient: yes   Scheduled Surgery Date: 04/06/2012  Proposed Surgery:  Right cochlear removal and reimplantation  Pre-op Dx: Otosclerosis, s/p cochlear implantation Dec '12    HISTORY  Patient Active Problem List    Diagnosis Date Noted    Wound dehiscence, surgical 03/29/2012    Sensorineural hearing loss, bilateral 05/28/2011    Sensory Hearing Loss, Bilateral 06/11/2007    Allergies   Allergen Reactions    Keflex (Cephalexin) Rash    Sulfa (Sulfonamide Antibiotics) Rash      No past medical history on file.  No past surgical history on file.   (Not in a hospital admission)   Social History     Occupational History    Not on file.     Social History Main Topics    Smoking status: Former Smoker -- 0.50 packs/day for 10 years     Types: Cigarettes     Quit date: 09/01/1982    Smokeless tobacco: Never Used    Alcohol Use: 0.5 oz/week     1 Glasses of wine per week      occasional    Drug Use: No    Sexually Active: Not on file    Active Inpatient Medications         No prescriptions on file.            No family history on file.     Anesthetic Hx:  general anesthesia, denies complications             CV Tests:  EKG    Patient Prescribed a Beta Blocker? No    Labs:  Lab Results   Lab Name Value Date/Time    WHITE BLOOD CELL COUNT 5.5 03/29/2012 1242    HEMOGLOBIN 13.0 03/29/2012 1242    HEMATOCRIT 38.8 03/29/2012 1242    PLATELET COUNT 312 03/29/2012 1242     Lab Results   Lab Name Value Date/Time    SODIUM 137 03/29/2012 1242    POTASSIUM 3.2* 03/29/2012 1242    CHLORIDE 101 03/29/2012 1242    CARBON DIOXIDE TOTAL 27 03/29/2012 1242    UREA NITROGEN, BLOOD (BUN) 15 03/29/2012 1242    CREATININE BLOOD 0.65 03/29/2012 1242    GLUCOSE 84 03/29/2012 1242     Lab Results   Lab Name Value Date/Time    INR 0.90 03/29/2012 1242     APTT 30.4 03/29/2012 1242     No results found for this basename: POCPREG, PREGURINE       ROS:  CV:  HTN, hyperlipidemia  Resp:  None  Neuro: None  Musculoskeletal:  None  Med:  None    Activity:  walk>1 mile    VITAL SIGNS:  Vital Signs (Last Recorded):         Pulse 76  Temp(Src) 36.3 C (97.3 F)  Resp 20  Wt 73 kg (160 lb 15 oz)  BMI 28.52 kg/m2  SpO2 98%        There is no height or weight on file to calculate BSA.  There is no weight on file to calculate BMI.    PE:  Mallampati image     Mallampati Class:  1  Oral Eval: Mouth opening normal and No loose teeth  Neck ROM:  full  Thyroid-mentum distance  in fingerbreaths: 3  Lungs:  normal, clear to auscultation bilaterally  Card:  regular    ASA Status: 2 - Mild, controlled systemic disease and no functional limitations    NPO Guidelines Met: yes    Consent:  Risks and benefits of General and Regional Anesthesia discussed with patient.  Questions answered and patient wishes to proceed.    Impression and Anesthetic Plan:   GETA    Daymon Larsen, CRNA   Electronically signed by:    Orpha Bur, CRNA, BSN, MHS  Senior Nurse Anesthetist, PI# [phone]  Pgr:  [phone]      Attending Addendum (04/06/2012):  This patient was seen, evaluated, and care plan was developed with the CRNA prior to induction.  I agree with the assessment and plan as outlined in the CRNA's note.     Elsie Ra, M.D.  Attending Physician  PI [phone], PG [phone]

## 2012-04-06 ENCOUNTER — Other Ambulatory Visit: Payer: Self-pay

## 2012-04-06 ENCOUNTER — Ambulatory Visit: Admit: 2012-04-06 | Admitting: Otolaryngology

## 2012-04-06 MED ORDER — HYDROMORPHONE (PF) 1 MG/ML INJECTION SYRINGE
0.2000 mg | INJECTION | INTRAMUSCULAR | Status: DC | PRN
Start: 2012-04-06 — End: 2012-04-06

## 2012-04-06 MED ORDER — LIDOCAINE HCL 10 MG/ML (1 %) INJECTION SOLUTION
0.1000 mL | INTRAMUSCULAR | Status: DC | PRN
Start: 2012-04-06 — End: 2012-04-06
  Administered 2012-04-06: 0.25 mL

## 2012-04-06 MED ORDER — SCOPOLAMINE 1 MG OVER 3 DAYS TRANSDERMAL PATCH
1.0000 | MEDICATED_PATCH | TRANSDERMAL | Status: DC
Start: 2012-04-06 — End: 2012-04-06
  Administered 2012-04-06: 1 via TRANSDERMAL
  Filled 2012-04-06: qty 1

## 2012-04-06 MED ORDER — ONDANSETRON HCL (PF) 4 MG/2 ML INJECTION SOLUTION
4.0000 mg | INTRAMUSCULAR | Status: DC | PRN
Start: 2012-04-06 — End: 2012-04-06

## 2012-04-06 MED ORDER — LACTATED RINGERS IV INFUSION
INTRAVENOUS | Status: DC
Start: 2012-04-06 — End: 2012-04-06
  Administered 2012-04-06: 09:00:00 via INTRAVENOUS

## 2012-04-06 MED ORDER — LACTATED RINGERS IV INFUSION
INTRAVENOUS | Status: DC
Start: 2012-04-06 — End: 2012-04-06

## 2012-04-06 MED ORDER — ACETAMINOPHEN 325 MG TABLET
650.0000 mg | ORAL_TABLET | Freq: Once | ORAL | Status: DC
Start: 2012-04-06 — End: 2012-04-06

## 2012-04-06 MED ORDER — MEPERIDINE (PF) 50 MG/ML INJECTION SOLUTION
12.5000 mg | INTRAMUSCULAR | Status: DC | PRN
Start: 2012-04-06 — End: 2012-04-06

## 2012-04-06 MED ORDER — REMOVE SCOPOLAMINE PATCH
1.0000 | Status: DC
Start: 2012-04-09 — End: 2012-04-06

## 2012-04-06 MED ORDER — HYDROCODONE 5 MG-ACETAMINOPHEN 325 MG TABLET
1.0000 | ORAL_TABLET | ORAL | 0 refills | Status: AC | PRN
Start: 2012-04-06 — End: 2012-04-13
  Filled 2012-04-06: qty 30, 7d supply, fill #0

## 2012-04-06 MED ORDER — CLINDAMYCIN HCL 300 MG CAPSULE
300.0000 mg | ORAL_CAPSULE | Freq: Three times a day (TID) | ORAL | 0 refills | Status: AC
Start: 2012-04-06 — End: 2012-04-13
  Filled 2012-04-06: qty 21, 7d supply, fill #0

## 2012-04-06 MED ORDER — FENTANYL (PF) 50 MCG/ML INJECTION SOLUTION
25.0000 ug | INTRAMUSCULAR | Status: DC | PRN
Start: 2012-04-06 — End: 2012-04-06

## 2012-04-06 NOTE — H&P (Addendum)
OTO HNS Pre-Op Note    Date: 04/06/2012    Procedure: Removal and replacement of right cochlear implant    History: The patient is 43yr female with a history of bilateral SNHL s/p right cochlear implant 08/2011. The implant is extruding through the incision site with buzzing and tinnitus on the right side. The patient was seen and examined. He/she denies any interval changes since the previous encounter on 03/29/12.    Informed consent signed.  The risks, benefits, and alternatives of the procedure have been discussed.  The patient and/or his/her care provider understands and agrees to procced with the operation.  All questions and concerns were addressed.    Electronically signed by:   Jennings Books, MD  PGY-4, Otolaryngology-HNS  Pager #: [phone]    Service Pager: [phone]

## 2012-04-06 NOTE — Nurse Focus (Signed)
Discharge to home via wheelchair to private auto at [facility].   Belongings with patient.  See EMR for assessment.  [name]

## 2012-04-06 NOTE — Nurse Focus (Signed)
PACU ADMIT NURSING NOTE    Note Started: 04/06/2012, 13:34     Received patient from OR at 1327 hours via gurney.  Monitor and Alarms on.  Patient sleepy but arousable. [name]

## 2012-04-06 NOTE — Progress Notes (Addendum)
ANESTHESIOLOGY POST OPERATIVE ASSESSMENT  Date: 04/06/2012 Time: 14:06   Date of Service (Patient contact): 04/06/2012     VITAL SIGNS    Vital Signs (Last Recorded):  BP: 136/57 mmHg  Pulse: 80   Resp: 13   Temp Max: 36.4 C (97.5 F)  (Last 24 hours)  Temp: 36.4 C (97.5 F)  SpO2: 98 % on    O2 Device (Oxygen Therapy): room air    RESPIRATORY FUNCTION     Lungs:  clear to auscultation bilaterally   Airway patency: Clear  Airway intervention: No intervention required     CARDIOVASCULAR FUNCTION    Cardiac:  regular    NEUROVASCULAR FUNCTION    Mental status: awake and alert    PAIN ASSESSMENT     pain Level None and last pain med:  None given     NAUSEA / VOMITING    Patient with no S&S of nausea .  Patients current PO status: NPO    POSTOPERATIVE HYDRATION    Intake/Output Summary (Last 24 hours) at 04/06/12 1406  Last data filed at 04/06/12 1327   Gross per 24 hour   Intake   1300 ml   Output      2 ml   Net   1298 ml     Current:   In: 1300 [Crystalloid:1300]  Out: 2 [Other:2]     ADDITIONAL PERTINENT INFORMATION  S/p Right cochlea implant removal and reimplantation, going home    Complications: no    Christella Scheuermann, MD Resident    Electronically signed by:   Jackson Latino, MD  PI# [phone], Pager# [phone]  [facility] Department of Anesthesiology and Pain Medicine  PGY3      Attending Addendum (04/06/2012):  This patient was evaluated, and care plan was developed with the resident.  I agree with the assessment and plan as outlined in the resident's note.     Elsie Ra, M.D.  Attending Physician  PI [phone], PG [phone]

## 2012-04-06 NOTE — Procedures (Signed)
ATTENDING BRIEF OPERATIVE NOTE  Service ENT  04/06/2012, 13:18    Date of Service: 04/06/12    Pre-Op Diagnosis:  Extruded right cochlear implant    Post-Op Diagnosis:  same    Procedure Performed/Description:  Right cochlear implant, removal old cochlear implant, operating microscope, facial nerve monitor    Name of Surgeon and Assistants:  Sharyon Cable    Type of Anesthesia:  gen    DVT Prophylaxis: yes    Findings:  Nucleus implant extruding through the incision, removed, replaced, full insertion Nucleus Freedom, muscle plugs to stop perilymph leak    Specimens Removed:  Old cochlear implant    EBL:  minimal    Drains:  no    Fluids:  fluid    Complications:  none    Outcome: should do well    Potential Modifiers: Any of the Following Modifiers Apply To This Case?  -22 Unusual procedural service (must document what made it unusual)  no  -62 Co-Surgery (both surgeons must separately document their portions no  -80 Designer, television/film set (faculty assist - non Medicare patient) no  Chartered loss adjuster (faculty assist- Medicare patient, no qualified residents available) no  Are there any other factors about the case that are important for correct coding?  no    Operating Room Procedure Presence: I was physically present for the entire procedure.    The information contained on this form is true and accurate to the best of my knowledge.  Further, I understand that if I misrepresent, falsify or conceal information regarding my participation in the professional service described above, I may be subject to fine, imprisonment, or civil penalty under applicable federal laws.     Aquilla Hacker, MD Attending Physician

## 2012-04-06 NOTE — Nurse Focus (Signed)
PRE OP NURSING NOTE    Note Started: 04/06/2012, 08:48     Cochlear implant revision procedure/surgery discussed with patient. Preoperative teaching done.  Alcide Clever, RN RN

## 2012-04-06 NOTE — Progress Notes (Addendum)
ANESTHESIOLOGY OPERATIVE NOTE  Date: 04/06/2012 Time: 13:31    Date of Service (Patient contact): 04/06/2012      Procedure: Right cochlea implant removal and reimplantation    Anesthesia: General    Estimated Blood Loss: <2cc    Intravenous Fluids: Crystalloid 1100 ml    Urine output: n/a  Anesthetic Complications: No apparent anesthetic complications  Disposition: Patient awake, alert, VSS, report given to PACU RN.  Electronically signed by:    Orpha Bur, CRNA, BSN, MHS  Senior Nurse Anesthetist, PI# [phone]  Pgr:  [phone]          Attending Addendum (04/06/2012):  This patient was evaluated, and care plan was developed with the resident.  I agree with the assessment and plan as outlined in the resident's note.     Elsie Ra, M.D.  Attending Physician  PI [phone], PG [phone]

## 2012-04-08 NOTE — Procedures (Signed)
PATIENTMAUDELL, [name]  MR #:  [id number]  DOB:  May 12, 1946  SEX:  F  AGE:  66  OPERATION DATE:  04/06/2012    INPATIENT OPERATION RECORD    PREOPERATIVE DIAGNOSIS:    Extruded right cochlear implant.    POSTOPERATIVE DIAGNOSIS:    Same.    PROCEDURES PERFORMED:    1.  Right cochlear implant.  2.  Removal of old cochlear implant.  3.  Operating microscope.  4.  Facial nerve monitoring x2 hours.    FINDING:    Nucleus implant extruding through the incision; removed, replaced,  full insertion of Nucleus Freedom, fascia/muscle plug to stop  perilymph leak.  The denuded Nucleus Freedom has a Model No.  CI24RE(CA).  Serial No. is [id number].    COMPLICATIONS:    None.    OUTCOME:    Stable, discharged to the PACU.    INDICATION FOR PROCEDURE:    The patient is a 66 year old woman with a history of bilateral  sensorineural hearing loss, status post right cochlear implant in  December 2012.  The implant was extruding through the incision site  with buzzing and tinnitus on the right side.  She presents today for  the described procedure.    DESCRIPTION OF PROCEDURE IN DETAIL:    The patient was brought to the Operating Room, placed in the supine  position.  General endotracheal anesthesia was induced.  A brief time-  out was performed identifying both the patient and the procedure.  All  were in agreement.    The patient was then turned 180 degrees.  The neuromonitor was then  applied.  Neuromonitoring was then used throughout the entire case for  facial nerve monitoring.    The postauricular region was then shaved.  The ear was then prepped  and draped in a sterile fashion.  Next, a 1:100,000 epinephrine was  then infiltrated in the postauricular region.  The microscope was then  brought into the field.  The operating microscope was used throughout  the entire case.  The ear canal was examined.  The TM was noted to  have some redness superiorly and posteriorly.  However, no perforation  was noted.    A 15-blade was then  used to make an incision in the postauricular  region, down to the level of the temporalis muscle superiorly and the  periosteum inferiorly.  The site of the extruding cochlear implant was  then incised anterior to this.  Dissection was then performed  anteriorly until the mastoid cavity was then encountered.  The ear was  then reflected anteriorly.  The subperiosteal elevation was then  performed posteriorly, and the previous extruding implant was then  partially removed.  The lead to the temporalis muscle was then slowly  teased out, and this was then excised. The ball tip of the lead was  retained.    Next, a scissor was used to excise the lead to the cochlea.  The  implant was then removed with the remainder of the electrode left in  the cochlea.  Next, the postauricular area was then dissected. The  mastoid cavity was then inspected. Notably, there was severe open  mucosa, as well as fibrosis of the mastoid cavity, which was carefully  gently teased out. The lead was then followed all the way down to the  facial recess, and the lead was noted to be entering the cochleostomy  in the facial recess.  Again, there was noted to be moderate fibrosis  and mucosalization around the lead, as well as the facial recess.  This was then carefully removed.  There was no evidence of facial  nerve dehiscence that was noted.    Once the lead was then removed, there was noted to be some purulent  drainage noting to come out of the cochleostomy.  In addition, the  stapes was noted to be partially fractured. However, the foot plate  was intact.  Once the lead was then completely removed and  cochleostomy was then visible, the new Nucleus Freedom, Serial No.  [id number] was then brought into the field.  The postauricular  region was then drilled out to create a well for the replacement  Nucleus Freedom.  Once this was adequately drilled, the Nucleus  Freedom was then inserted.  The ground was then placed into the  temporalis  muscle.  A Vicryl was then placed across the Nucleus  Freedom, and the electrode was then inserted into the cochleostomy  without any problem.    The stylette was then removed.  There was noted to be slow perilymph  drainage.  As a result, fascia and muscle were then harvested, and  this was then used to plug the cochleostomy around the electrode.  The  cochleostomy was then inspected.  There was no additional perilymph  drainage noted.  The periosteum was then closed over the mastoid  cavity.  The ear was then closed in the deep layer using a 3-0 Vicryl,  and the skin was then closed using Steri-Strips.  The ear was then  inspected.  The EAC was clear.  The TM was noted to be intact, and  there was no perforation noted.    The patient tolerated the procedure well.  The patient was then  returned to Anesthesia and awakened without complication.    POSTOPERATIVE PLAN:    The patient will follow up in the ENT Clinic in approximately one week  for inspection of the wound site.  The patient will be placed on  antibiotics for 1 week.    NAME OF SURGEONS AND ASSISTANTS:    Surgeon:  Aquilla Hacker, MD, PhD  Assistant Surgeon:  Jennings Books, MD      Report Electronically Signed - 04/23/2012 23:47:17 by  Jennings Books, MD  Resident  Otolaryngology    Report Electronically Signed - 04/24/2012 13:58:39 by  Aquilla Hacker, MD, PhD  Professor  Otolaryngology    AQT/co  D:  04/07/2012 21:47:57 PDT/PST  T:  04/08/2012 08:26:32 PDT/PST  Job #:  [id number] / [id number]

## 2012-04-12 ENCOUNTER — Ambulatory Visit: Admitting: Nurse Practitioner

## 2012-04-12 NOTE — Progress Notes (Signed)
Otolaryngology Head and Neck Surgery Clinic Note    CC:  [name] is a 66yr old female presenting s/p right cochlear implant removal and replacement with Dr. Ralph Leyden on 04/06/12       Subjective:  HISTORY OF PRESENT ILLNESS:  [name] is a 66yr old female who returns POD# 6 from removal of an extruded right cochlear implant and replacement. She is feeling well and denies any pain or swelling to the implant area. She has no vertigo. She has been trying to avoid using her glasses for fear of pressure to the implant site.       Objective:   PHYSICAL EXAMINATION:    General: No acute distress or pain; Alert & oriented to time, place and person. Affect appropriate to mood.   Head: Atraumatic/Normocephalic  Ears:  Right: Normal pinna with postauricular steristrips, removed, revealing an intact and dry postauricular incision, no fluctuance or tenderness, no drainage, External auditory canal clear; Tympanic membrane intact, anticipated hemotympanum noted      ASSESSMENT/PLAN:  (V58.71) Aftercare following surgery of the sense organs, NEC  (primary encounter diagnosis)  (389.18) Sensorineural hearing loss, bilateral  Comment: Doing well, no pain, no swelling of the site. Canal is clear. Incision site is clear, without drainage, not overlying implant site. Dr. Ralph Leyden evaluated.   Plan:     -Patient reports she had Pneumovax 3 years ago  -Return for activation in 1 month with Marcina Millard  -See Dr. Ralph Leyden same day      Dr. Ralph Leyden was the supervising attending in clinic today and together we evaluated the patient and developed the plan of care.      Wyline Copas  PI# [id number]  [facility]  Department of Otolaryngology  Pager: [phone]

## 2012-04-12 NOTE — Progress Notes (Signed)
The patient was seen by the resident.  I reviewed and agree with the resident's findings and plan as outlined in the resident's note above.     Karen Jo Doyle

## 2012-04-12 NOTE — Nursing Note (Signed)
>>   [name]     Fri Apr 12, 2012 10:26 AM  Vital signs assessed and documented. ID verified x 2. Patient escorted to room for examination by physician. Learning barriers assessed-None Identified.Ear Microscope set up for use.   Drucie Opitz, [place]

## 2012-04-17 MED FILL — bacitracin 500 unit/gram topical ointment: TOPICAL | Qty: 28.4 | Status: AC

## 2012-04-17 MED FILL — sodium chloride 0.9 % irrigation solution: Qty: 500 | Status: AC

## 2012-04-17 MED FILL — lidocaine 1 %-epinephrine 1:100,000 injection solution: INTRAMUSCULAR | Qty: 20 | Status: AC

## 2012-04-17 MED FILL — water for irrigation, sterile solution: Qty: 2000 | Status: AC

## 2012-04-17 MED FILL — sodium chloride 0.9 % irrigation solution: Qty: 2000 | Status: AC

## 2012-04-17 NOTE — Progress Notes (Signed)
The patient was seen by the resident.  I reviewed and agree with the resident's findings and plan as outlined in the resident's note above.     Zoeie Ritter Jo Kyrra Prada

## 2012-04-26 ENCOUNTER — Ambulatory Visit: Payer: PRIVATE HEALTH INSURANCE | Admitting: Audiologist

## 2012-05-10 ENCOUNTER — Ambulatory Visit: Admitting: Otolaryngology

## 2012-05-10 ENCOUNTER — Ambulatory Visit: Payer: PRIVATE HEALTH INSURANCE

## 2012-05-10 ENCOUNTER — Ambulatory Visit: Admitting: Audiologist

## 2012-05-10 VITALS — Temp 97.8°F | Ht 63.0 in | Wt 164.9 lb

## 2012-05-10 NOTE — Nursing Note (Signed)
>>   Cam Hai, [place]     Caleen Essex May 10, 2012  4:59 PM  Pt. Roomed, name & DOB verified.  Allergies,Height, Weight, Temp checked, meds & pain reviewed. Hearing difficulty. Pt.hard of hearing assisted with hearing device. Post op surgical site cleaned and sutures removed, set up and clean up of room. M.HARPER,Sr.LVN

## 2012-05-12 NOTE — Progress Notes (Signed)
Followup new right cochlear implant after extrusion.  Had tuneup this morning and went very well.  Hearing is satisfactory.    Exam:  Wound healing without sign of infection - some vicryls coming out through skin edges, trimmed.  TM clear.    Followup one month.

## 2012-05-13 NOTE — Procedures (Signed)
[name] was seen in the office today for initial activation of Nucleus Freedom cochlear implant and CP-810 sound processors.  She was re-implanted because of a wound breakdown.  Surgery recovery was mostly uneventful.  Implant tests indicated normal performance for all 22 intracochlear and the 2 extracochlear electrodes. Impedance levels were variable, as would be expected with a new device.      A 900 Hz/channel 8 maxima map was created to obtain the initial threshold and comfort levels.  Low frequency T levels were similar to the old implant but the high frequency responses were present at lower current levels than before. Dynamic ranges were good and overall current levels necessary for hearing responses were low.  When the microphone was activated, [name] described 'just noise' initially but the signal gradually improved as we made further adjustments.  She had a persistent 'echo' for speech but this also improved slightly as we worked on the map.  Modifications to the maps were made using the map tools. Progressive maps were loaded into the processor.  Her overall impression was that this implant was providing improved performance and seemed more compatible with her hearing aid than her old device. The maps are summarized in the session details in the cochlear implant file.     Use and care of the processor, remote assistant and accessories were discussed. She did not receive new processors, just a new implant so she was already familiar with operation of the processor and remote assistant. [name] demonstrated good understanding of the disposable batteries, rechargeable batteries, processor controls and use of the remote system.      [name] will return to the office in 4 to 5 weeks. She will contact me if there are any questions or concerns about the implant.    Report Electronically Signed By:    Name:   Marcelyn Ditty, M.A.  Training Level:  Health and safety inspector  Department:  ENT  Phone #:  [phone]

## 2012-06-07 ENCOUNTER — Ambulatory Visit: Admitting: Audiologist

## 2012-06-07 ENCOUNTER — Ambulatory Visit: Admitting: Otolaryngology

## 2012-06-07 ENCOUNTER — Ambulatory Visit: Payer: PRIVATE HEALTH INSURANCE

## 2012-06-07 VITALS — Temp 97.1°F | Ht 63.0 in | Wt 163.4 lb

## 2012-06-07 NOTE — Progress Notes (Signed)
Doing better with this implant than with the last one.  Sounds echo but understanding speech better.    Microscope:  TM clear.  Wound nicely healed.    Imp:  Doing well with new implant.      Plan:  Continue programming.  Followup one month.  Still in global postop period.

## 2012-06-07 NOTE — Nursing Note (Signed)
>>   Cam Hai, [place]     Caleen Essex Jun 07, 2012 10:14 AM  Pt. Roomed, name & DOB verified, Height, Weight, Temp checked.  Allergies, meds & pain reviewed. Hearing difficulty. Pt.hard of hearing assisted with hearing device. MD.use of Binocular scope set up and clean up done. M.HARPER,Sr.LVN

## 2012-06-09 NOTE — Procedures (Signed)
[name] was seen in the office today for cochlear implant programming. This was her 1st visit since activation of her 2nd right cochlear implant. She reported that she is already doing better than she did with the 1st implant. She is distinguishing female and female voices and making out some words without help from her hearing aid. She has stayed on the 2nd of the progressive maps.    A cochlear implant impedance test was performed and all electrode channels were working properly.  New threshold and comfort levels were established.  She had T-Tail responses again so we set the threshold levels at the point where the loudness started to change.  Comfort levels were set for 5 electrodes and interpolation was used to fill in the unmeasured electrodes. We also did loudness balancing in groups of 5. Dynamic ranges ranged from 28 to 35 clinical units.  We needed slightly less current in the higher frequencies.  We tried a map using 10 instead of 8 Maxima and this did not sound as good. She still complains of some echo but this has improved.  Modifications to the maps were made using the usual smart sound modifications. Four maps were loaded into the speech processor.  They are summarized in the session details in the cochlear implant mapping software.  The backup processor was also mapped.       [name] will return to the office in 2 months.  She will contact me sooner if there are any questions or concerns about the implant.       Report Electronically Signed By:    Name:   Marcelyn Ditty, M.A.  Training Level:  Scientist, research (medical)  Department:  ENT  Phone #:  [phone]

## 2012-07-19 ENCOUNTER — Ambulatory Visit: Admitting: Otolaryngology

## 2012-07-19 ENCOUNTER — Ambulatory Visit: Admitting: Audiologist

## 2012-07-19 ENCOUNTER — Ambulatory Visit: Payer: PRIVATE HEALTH INSURANCE

## 2012-07-19 VITALS — Temp 99.2°F | Ht 63.0 in | Wt 167.8 lb

## 2012-07-19 NOTE — Progress Notes (Signed)
Having a big problem with roaring right ear tinnitus that is worse when the implant is on and interferes with discrimination - Leeroy Cha adjusting programming.  Hearing better with this implant than with the previous.    Exam: canals clear, incisions well healed, tympanic membranes clear.    Imp:  Doing better with new right cochlear implant than previous implant.    Plan:  followup 6 months.

## 2012-07-19 NOTE — Nursing Note (Signed)
>>   [name], [place]     Fri Jul 19, 2012  4:30 PM  Pt. Roomed, name & DOB verified, Height, Weight, Temp checked.  Allergies, meds & pain reviewed. Hearing difficulty. Pt.hard of hearing assisted with hearing device. MD.use of Binocular scope set up and clean up done. M.HARPER,Sr.LVN

## 2012-07-21 NOTE — Procedures (Signed)
[name] was seen in the office today for cochlear implant programming.  She reports that she is not doing well with her implant.  She had her hearing aid re-programmed recently.  Her main complaint is tinnitus from the right ear, whether the implant is active or not.  She uses the everyday and focus programs.  We also discussed the fact that, even though she had recent implant replacement surgery, she would not be eligible for N-6 processors.  The warranty coverage was for the implant only.     A cochlear implant impedance test was performed and all electrode channels were working properly.  New threshold and comfort levels were established using map 45 as a basis.  High frequency thresholds were increased relative to map 45.   Several maps were created from the new levels.  Modifications to the maps were made using the map tools.  Four maps were loaded into the speech processor using the usual Smartsound modifications.  They are summarized in the session details in the cochlear implant mapping software.  The backup processor was also mapped.       [name] will return to the office in 4 months.  She is following up with Dr. Ralph Leyden today.  She will contact me sooner if there are any questions or concerns about the implant.       Report Electronically Signed By:    Name:   Marcelyn Ditty, M.A.  Training Level:  Scientist, research (medical)  Department:  ENT  Phone #:  [phone]

## 2012-09-06 ENCOUNTER — Ambulatory Visit: Payer: PRIVATE HEALTH INSURANCE

## 2012-09-06 ENCOUNTER — Ambulatory Visit: Admitting: Audiologist

## 2012-09-18 NOTE — Procedures (Signed)
[name] was seen in the office today for cochlear implant programming.  She reports variable performance.  At times, she is able to understand speech without seeing the speaker but at others, isn't able to comprehend, even with lipreading.  She reports that her tinnitus is still severe but variable.  She finds that the 'focus' program is most beneficial overall.       A cochlear implant impedance test was performed and all electrode channels were working properly.  NRT responses were collected for 5 channels.  Responses showed good wave morphology. T-NRT's ranged from 179 to 194 clinical units.  After NRT testing, new threshold and comfort levels were established.  We did some loudness balancing after flattening the levels based on the NRT testing. The T and C levels for the new map (50) were different than her behavioral-testing map (48) but [name] liked the sound quality.  Everyday and focus versions of both maps were loaded into the sound procesor.  The maps are summarized in the session details in the cochlear implant mapping software.  The backup processor was also mapped.       [name] will return to the office in 6 weeks.  She will contact me sooner if there are any questions or concerns about the implant.  I would like to start with sound-field testing next time using her preferred map.        Report Electronically Signed By:    Name:   Marcelyn Ditty, M.A.  Training Level:  Scientist, research (medical)  Department:  ENT  Phone #:  [phone]

## 2012-10-25 ENCOUNTER — Ambulatory Visit: Payer: PRIVATE HEALTH INSURANCE | Admitting: Audiologist

## 2012-10-25 ENCOUNTER — Ambulatory Visit: Payer: PRIVATE HEALTH INSURANCE

## 2012-11-07 ENCOUNTER — Ambulatory Visit: Payer: PRIVATE HEALTH INSURANCE | Admitting: Audiologist

## 2012-11-14 ENCOUNTER — Ambulatory Visit: Admitting: Audiologist

## 2012-11-17 NOTE — Procedures (Signed)
[name] was seen in the office today for cochlear implant programming. I sent her home with 2 maps the last time she was here. One of them was based on her behavioral responses. The other was more flat and based on her NRT responses. She preferred the 2nd map. [name] had some questions about using her T-Coil neck loop. She has her home and office looped.  She reports that the sound from the T-coil is somewhat loud and distorted. I decided to change the mixing ratio to 1:1.     A cochlear implant impedance test was performed and all electrode channels were working properly.  New threshold and comfort levels were established. We used map 50 as a basis.  [name] reported that she would like a program with a little less volume. T and C levels were reduced slightly to create map 52. Four maps were loaded into the speech processor.  They are summarized in the session details in the cochlear implant mapping software.  The backup processor was also mapped.       [name] will return to the office in a few months.  I will schedule her for an extra half hour so that I can repeat her left audiogram. She would like this done so that she can have her hearing aid reprogrammed in [place]. I pointed out that she could do this there but she would prefer to have her hearing test done here. She will contact me sooner if there are any questions or concerns about the implant.       Report Electronically Signed By:    Name:   Marcelyn Ditty, M.A.  Training Level:  Scientist, research (medical)  Department:  ENT  Phone #:  [phone]

## 2013-01-17 ENCOUNTER — Ambulatory Visit: Payer: PRIVATE HEALTH INSURANCE

## 2013-01-17 ENCOUNTER — Ambulatory Visit: Admitting: Audiologist

## 2013-01-18 NOTE — Procedures (Signed)
[name] was seen in the office today for cochlear implant programming. She's making gradual progress and feels that the new implant is working better than the first implant. However, she still has significant problems with tinnitus in the implanted ear. She finds that on some days she does very well and on other days she struggles with almost every communication situation.   [name] also reports an occasional clicking sound from her processor.  The sound from the implant seems to shut off for a very short period of time. She has noticed this with both processors.  The problem is intermittent. She is using two different programs, one with slightly higher current levels. She finds the higher-level program has an echoic quality at times but it is beneficial in some listening environments. She uses the softer program more often.     On the last visit, [name] indicated she would like to have her audiogram updated today. The left ear was tested using insert earphones and the bone conduction oscillator. Hearing levels were stable relative to her audiogram from 2012. Her word recognition score using live voice materials was 76%. On her last audiogram, obtained in [place], the score was 72%. I also tesed her word recognition using recorded materials. The score for the NU-6 recording was 56%.   This was consistent with the greater difficulty of the recorded test. [name]' hearing in the left ear was remarkably stable relative to her last test.    A cochlear implant impedance test was performed and all electrode channels were working properly.  New threshold and comfort levels were established.  I did not change the softer programin  locations one and two. Using the slightly louder program as a basis, I tilted the response to decrease the low frequencies and increase the high frequencies. She reported better sound quality. Two versions of this map were loaded into locations three and four.  They are  summarized in the session details in the cochlear implant mapping software.  The backup processor was also mapped.       [name] will return to the office in 3 months.  She will contact me sooner if there are any questions or concerns about the implant.       Report Electronically Signed By:    Name:   Marcelyn Ditty, M.A.  Training Level:  Scientist, research (medical)  Department:  ENT  Phone #:  [phone]

## 2013-05-26 ENCOUNTER — Other Ambulatory Visit: Payer: Self-pay

## 2013-05-30 ENCOUNTER — Ambulatory Visit: Payer: BLUE CROSS/BLUE SHIELD | Admitting: Audiologist

## 2013-05-31 ENCOUNTER — Other Ambulatory Visit: Payer: Self-pay

## 2013-06-06 ENCOUNTER — Ambulatory Visit: Payer: BLUE CROSS/BLUE SHIELD | Attending: Audiologist | Admitting: Audiologist

## 2013-06-06 DIAGNOSIS — H903 Sensorineural hearing loss, bilateral: Secondary | ICD-10-CM | POA: Insufficient documentation

## 2013-06-06 NOTE — Progress Notes (Signed)
[name] was seen in the office today for a cochlear implant follow up appointment. The patient reports frustration due to her tinnitus. Her tinnitus often impacts her ability to understand speech. The tinnitus varies day to day but is often the better in the mornings and then progressively gets worse throughout the day. Some days are better in general for her than others.     A cochlear implant impedance test was performed and all electrode channels were working properly. Loudness balancing was done across all electrodes at both comfort level and at 60% of dynamic range. Electrodes 1 and 2 were reported to be softer in volume. The patient reported all other electrodes to be balanced in loudness. The Gain Shaper and Tilt features were used to help increase low frequency sounds.  A new map was created based off of these changes (Map 55).  Map 52 was reported by the patient to be her favorite map. Two different versions of maps 52 and 55 were saved in her speech processor (everyday and focus). The backup processor was also mapped.       [name] will return to the office in approximately 3 months. [name] will contact [facility] sooner if there are any questions or concerns about the implant.       [name], B.S.   Audiology Extern

## 2013-06-07 ENCOUNTER — Other Ambulatory Visit: Payer: Self-pay

## 2013-06-09 NOTE — Procedures (Signed)
This patient was seen, evaluated, and a care plan was developed with the audiology extern.  I agree with the assessment and treatment plan as outlined in the extern's note.  I was present for the entire patient visit.       Report electronically signed by Amee Boothe.

## 2013-06-21 ENCOUNTER — Other Ambulatory Visit: Payer: Self-pay

## 2013-08-02 ENCOUNTER — Other Ambulatory Visit: Payer: Self-pay

## 2013-08-29 ENCOUNTER — Ambulatory Visit: Payer: Medicare Other | Attending: Audiologist | Admitting: Audiologist

## 2013-08-29 DIAGNOSIS — H903 Sensorineural hearing loss, bilateral: Secondary | ICD-10-CM | POA: Insufficient documentation

## 2013-09-03 NOTE — Procedures (Signed)
[name] was seen in the office today for cochlear implant programming. Her implant performance fluctuates based on her tinnitus levels. The tinnitus seems random and she is not been able to relate to any other factors. For the last two days she has not done well with her implant and has had high tinnitus levels.  Before this she was doing quite well.     A cochlear implant impedance test was performed and all electrode channels were working properly.   I pointed out that the comfort levels were pretty far away from her NRT  responses. I suggested that we increase her C levels a bit to get them closer to the NRT's. This was done and she tolerated the resulting map very well. The new map (56) was loaded in the locations one and two. Map 55 was left in locations three and four. The maps are further summarized in the session details in the cochlear implant mapping software.  The backup processor was also mapped.       [name] will return to the office in 3 months. She will contact me sooner if there are any questions or concerns about the implant.       Report Electronically Signed By:    Name:   Marcelyn Ditty, M.A.  Training Level:  Scientist, research (medical)  Department:  ENT  Phone #:  [phone]

## 2013-12-05 ENCOUNTER — Ambulatory Visit: Payer: Medicare Other | Attending: Audiologist | Admitting: Audiologist

## 2013-12-05 DIAGNOSIS — H903 Sensorineural hearing loss, bilateral: Principal | ICD-10-CM | POA: Insufficient documentation

## 2013-12-13 NOTE — Procedures (Signed)
Sabrina OtoClaudia [name] was seen in the office today for cochlear implant programming.  She reports significantly improved performance from her cochlear implant.  She indicated that she needed a replacement coil cable.  She use the maps in locations 1 and 2 (56) and has found the other maps were too soft.       A cochlear implant impedance test was performed and all electrode channels were working properly.  New threshold and comfort levels were established using map 56 as a basis.  'Everyday' and 'Focus' maps were created from the new levels.  Modifications to the maps were made using the map tools.  Four maps were loaded into the speech processor. The new maps (57) were loaded into locations 3 and 4.  The maps in locations 1 and 2 (56) were left unchanged.  They are further summarized in the session details in the cochlear implant mapping software.  The backup processor was also mapped.       The cable was ordered from the Cochlear website during today's appointment.  It will be sent to [name]' home. She will return to the office in 6 months and will contact me sooner if there are any questions or concerns about the implant.       Report Electronically Signed By:    Name:   Marcelyn Dittyavid Karyssa Amaral, M.A.  Training Level:  Scientist, research (medical)enior Audiologist  Department:  ENT  Phone #:  [phone]

## 2014-06-04 ENCOUNTER — Ambulatory Visit: Payer: BLUE CROSS/BLUE SHIELD | Attending: Audiologist | Admitting: Audiologist

## 2014-06-04 DIAGNOSIS — H903 Sensorineural hearing loss, bilateral: Principal | ICD-10-CM | POA: Insufficient documentation

## 2014-06-05 ENCOUNTER — Ambulatory Visit: Payer: BLUE CROSS/BLUE SHIELD | Admitting: Audiologist

## 2014-06-07 NOTE — Procedures (Signed)
[name] was seen in the office today for cochlear implant programming.  She uses the focus version of map 57 in location 4 most but uses the other 3 maps periodically.  However she reports that the sound output is too high at times. She compensates by lowering her volume and sensitivity settings.      A cochlear implant impedance test was performed and all electrode channels were working properly. Impedances were low and even across the array.  New threshold and comfort levels were established. I created a new map (58) with comfort levels between maps 56 and 57.  Those 2 maps had a 10 cu difference and map 58 has a 5 cu difference.  [name] reported god sound quality.  Four maps were loaded into the speech processor.  There were focus, noise and music settings.  They are further summarized in the session details in the cochlear implant mapping software.  The backup processor was also mapped.      [name] is interested in obtaining a new hearing aid here.  I will discuss this with Dr. Artis Flock.  [name] will return to the office in March.  She will be retiring to [place] in April.  We looked at some implant centers in the area.  She  will contact me sooner if there are any questions or concerns about the implant.       Report Electronically Signed By:    Name:   Marcelyn Ditty, M.A.  Training Level:  Scientist, research (medical)  Department:  ENT  Phone #:  [phone]

## 2014-06-12 ENCOUNTER — Encounter: Payer: Self-pay | Admitting: Audiologist

## 2014-06-14 ENCOUNTER — Other Ambulatory Visit: Payer: Self-pay

## 2014-10-16 ENCOUNTER — Ambulatory Visit: Payer: Medicare Other | Admitting: Audiologist

## 2014-10-30 ENCOUNTER — Ambulatory Visit: Payer: Medicare Other | Attending: Audiologist | Admitting: Audiologist

## 2014-10-30 DIAGNOSIS — H903 Sensorineural hearing loss, bilateral: Principal | ICD-10-CM | POA: Insufficient documentation

## 2014-10-30 NOTE — Procedures (Signed)
Cochlear Implant Check    Sabrina OtoClaudia [name] was seen in the office today for cochlear implant programming. She was accompanied by her husband [name]. [place] will be moving to [place] in [place] weeks. [name] was last seen on 06/04/2014. Since then she continues to experience a lot of variability with her right cochlear implant. She reports spending a significant amount of time changing between programing and adjusting the volume and sensitivity settings to find the best settings for a given listening environment. She uses the noise program the most and finds that the music program is not helpful. [name] has noticed that her tinnitus has been especially bothersome lately and seems triggered by noise She also reports that her hearing aid is on its last leg and she is anxious to get to [place] so that she can get it replaced.     A cochlear implant impedance test was performed and all electrode channels were working properly. Based on map 58, the stimulation rate was lowered to 250. New threshold and comfort levels were established. The high frequency threshold and comfort levels had to be increased by a signficant amount in order for them to be audible. One new map was created from the new levels and modifications were made using the map tools. [name] reported favorably on the sound quality of this new map. Four maps were loaded into the speech processor. They are summarized in the session details in the cochlear implant mapping software. The backup processor was also mapped.       [name] and will be transfer to a cochlear implant center in the area. She was provided with [name]'s email address so that he can share her map files with her new implant audiologist.     [name], B.S.  Audiology Extern

## 2014-11-04 NOTE — Procedures (Signed)
This patient was seen, evaluated, and a care plan was developed with the audiology extern.  I agree with the assessment and treatment plan as outlined in the extern's note.  I was present for the entire patient visit.      Report electronically signed by:    Ruben Mahler, M.A.  Senior Audiologist  Department of Otolaryngology

## 2014-12-15 DIAGNOSIS — E7800 Pure hypercholesterolemia, unspecified: Secondary | ICD-10-CM | POA: Insufficient documentation

## 2014-12-15 DIAGNOSIS — K219 Gastro-esophageal reflux disease without esophagitis: Secondary | ICD-10-CM | POA: Insufficient documentation

## 2015-01-19 DIAGNOSIS — E876 Hypokalemia: Secondary | ICD-10-CM | POA: Insufficient documentation

## 2015-01-19 DIAGNOSIS — I10 Essential (primary) hypertension: Secondary | ICD-10-CM | POA: Insufficient documentation

## 2015-02-05 DIAGNOSIS — E559 Vitamin D deficiency, unspecified: Secondary | ICD-10-CM | POA: Insufficient documentation

## 2015-02-18 DIAGNOSIS — N951 Menopausal and female climacteric states: Secondary | ICD-10-CM | POA: Insufficient documentation

## 2015-03-03 HISTORY — PX: COLONOSCOPY: SHX174

## 2015-05-18 DIAGNOSIS — E663 Overweight: Secondary | ICD-10-CM | POA: Insufficient documentation

## 2015-11-03 DIAGNOSIS — Z1211 Encounter for screening for malignant neoplasm of colon: Secondary | ICD-10-CM | POA: Insufficient documentation

## 2016-02-21 DIAGNOSIS — D508 Other iron deficiency anemias: Secondary | ICD-10-CM | POA: Insufficient documentation

## 2016-03-09 DIAGNOSIS — G8929 Other chronic pain: Secondary | ICD-10-CM | POA: Insufficient documentation

## 2016-03-09 DIAGNOSIS — M25571 Pain in right ankle and joints of right foot: Secondary | ICD-10-CM

## 2017-05-24 ENCOUNTER — Other Ambulatory Visit: Payer: Self-pay | Admitting: Orthopedic Surgery

## 2017-05-24 DIAGNOSIS — G8929 Other chronic pain: Secondary | ICD-10-CM

## 2017-05-24 DIAGNOSIS — M542 Cervicalgia: Principal | ICD-10-CM

## 2017-06-05 ENCOUNTER — Ambulatory Visit
Admission: RE | Admit: 2017-06-05 | Discharge: 2017-06-05 | Disposition: A | Discharge status: Home / Self Care | Payer: Self-pay | Source: Ambulatory Visit | Attending: Orthopedic Surgery | Admitting: Orthopedic Surgery

## 2017-06-05 DIAGNOSIS — M542 Cervicalgia: Principal | ICD-10-CM

## 2017-06-05 DIAGNOSIS — G8929 Other chronic pain: Secondary | ICD-10-CM

## 2017-06-05 MED ORDER — IOPAMIDOL (ISOVUE-M 300) INJECTION 61%
10.0000 mL | Freq: Once | INTRAMUSCULAR | Status: AC | PRN
  Administered 2017-06-05: 10 mL via INTRATHECAL

## 2017-06-05 MED ORDER — DIAZEPAM 5 MG PO TABS
5.0000 mg | ORAL_TABLET | Freq: Once | ORAL | Status: AC
  Administered 2017-06-05: 5 mg via ORAL

## 2017-06-05 NOTE — Discharge Instructions (Signed)

## 2017-08-09 ENCOUNTER — Emergency Department (HOSPITAL_BASED_OUTPATIENT_CLINIC_OR_DEPARTMENT_OTHER)
Admission: EM | Admit: 2017-08-09 | Discharge: 2017-08-09 | Disposition: A | Discharge status: Home / Self Care | Payer: Medicare Other | Attending: Emergency Medicine | Admitting: Emergency Medicine

## 2017-08-09 ENCOUNTER — Encounter (HOSPITAL_BASED_OUTPATIENT_CLINIC_OR_DEPARTMENT_OTHER): Payer: Self-pay

## 2017-08-09 ENCOUNTER — Other Ambulatory Visit: Payer: Self-pay

## 2017-08-09 ENCOUNTER — Emergency Department (HOSPITAL_BASED_OUTPATIENT_CLINIC_OR_DEPARTMENT_OTHER): Payer: Medicare Other

## 2017-08-09 DIAGNOSIS — S0990XA Unspecified injury of head, initial encounter: Secondary | ICD-10-CM | POA: Insufficient documentation

## 2017-08-09 DIAGNOSIS — E785 Hyperlipidemia, unspecified: Secondary | ICD-10-CM | POA: Insufficient documentation

## 2017-08-09 DIAGNOSIS — I10 Essential (primary) hypertension: Secondary | ICD-10-CM | POA: Insufficient documentation

## 2017-08-09 DIAGNOSIS — E7800 Pure hypercholesterolemia, unspecified: Secondary | ICD-10-CM | POA: Insufficient documentation

## 2017-08-09 DIAGNOSIS — Y998 Other external cause status: Secondary | ICD-10-CM | POA: Diagnosis not present

## 2017-08-09 DIAGNOSIS — S61216A Laceration without foreign body of right little finger without damage to nail, initial encounter: Secondary | ICD-10-CM | POA: Insufficient documentation

## 2017-08-09 DIAGNOSIS — W19XXXA Unspecified fall, initial encounter: Secondary | ICD-10-CM

## 2017-08-09 DIAGNOSIS — S0083XA Contusion of other part of head, initial encounter: Secondary | ICD-10-CM

## 2017-08-09 DIAGNOSIS — Y929 Unspecified place or not applicable: Secondary | ICD-10-CM | POA: Insufficient documentation

## 2017-08-09 DIAGNOSIS — S0121XA Laceration without foreign body of nose, initial encounter: Secondary | ICD-10-CM | POA: Diagnosis not present

## 2017-08-09 DIAGNOSIS — W010XXA Fall on same level from slipping, tripping and stumbling without subsequent striking against object, initial encounter: Secondary | ICD-10-CM | POA: Diagnosis not present

## 2017-08-09 DIAGNOSIS — Y9389 Activity, other specified: Secondary | ICD-10-CM | POA: Diagnosis not present

## 2017-08-09 DIAGNOSIS — Z79899 Other long term (current) drug therapy: Secondary | ICD-10-CM | POA: Diagnosis not present

## 2017-08-09 HISTORY — DX: Essential (primary) hypertension: I10

## 2017-08-09 HISTORY — DX: Pure hypercholesterolemia, unspecified: E78.00

## 2017-08-09 HISTORY — DX: Unspecified hearing loss, unspecified ear: H91.90

## 2017-08-09 MED ORDER — LIDOCAINE HCL (PF) 1 % IJ SOLN
INTRAMUSCULAR | Status: AC
  Filled 2017-08-09: qty 5

## 2017-08-09 MED ORDER — LIDOCAINE HCL (PF) 1 % IJ SOLN
30.0000 mL | Freq: Once | INTRAMUSCULAR | Status: DC
  Filled 2017-08-09: qty 30

## 2017-08-09 NOTE — Discharge Instructions (Addendum)
Please see the information and instructions below regarding your visit.  Your diagnoses today include:  1. Fall, initial encounter   2. Contusion of face, initial encounter   3. Laceration of right little finger without foreign body without damage to nail, initial encounter      Tests performed today include: X-ray of the affected area that did not show any foreign bodies or broken bones Vital signs. See below for your results today.   Medications prescribed:   Take any prescribed medications only as directed.  Tylenol for pain.   Home care instructions:  Follow any educational materials and wound care instructions contained in this packet.   Keep affected area above the level of your heart when possible to minimize swelling. Wash area gently twice a day with warm soapy water. Do not apply alcohol or hydrogen peroxide directly over a wound. Cover the area if it is draining or weeping. Keep the bandage in place for 24 hours and refrain from getting the wound wet for 24 hours. After that, you may get the area wet, but please ensure that you dry it completely afterwards.  Please refrain from soaking sutures for long periods of time, or swimming in chlorinated water   You may apply antibiotic ointment such as Bacitracin or Neosporin.  Follow-up instructions: Suture Removal: Return to the Emergency Department or see your primary care care doctor in 7-10 days for a recheck of your wound and removal of your sutures or staples.    Return instructions:  Return to the Emergency Department if you have: Fever Worsening pain Worsening swelling of the wound Pus draining from the wound Redness of the skin that moves away from the wound, especially if it streaks away from the affected area  Any other emergent concerns  Your vital signs today were: BP (!) 152/64 (BP Location: Left Arm)    Pulse 66    Temp 97.7 F (36.5 C) (Oral)    Resp 18    Ht 5\' 3"  (1.6 m)    Wt 77.1 kg (170 lb)    SpO2  98%    BMI 30.11 kg/m  If your blood pressure (BP) was elevated on multiple readings during this visit above 130 for the top number or above 80 for the bottom number, please have this repeated by your primary care provider within one month. --------------  Thank you for allowing us to participate in your care today! It was a pleasure taking care of you.

## 2017-08-09 NOTE — ED Provider Notes (Signed)
  Physical Exam  BP (!) 152/64 (BP Location: Left Arm)   Pulse 66   Temp 97.7 F (36.5 C) (Oral)   Resp 18   Ht 5\' 3"  (1.6 m)   Wt 77.1 kg (170 lb)   SpO2 98%   BMI 30.11 kg/m   Assumed care from Dr. Fredderick PhenixBelfi to complete suturing. Briefly, the patient is a 71 y.o. female with PMHx of  has a past medical history of High cholesterol, HOH (hard of hearing), and Hypertension. here with fall resulting in lacerations. I assumed care and discharge of patient to perform laceration repair.   Labs Reviewed - No data to display  ED Course  .Marland Kitchen.Laceration Repair Date/Time: 08/09/2017 5:10 PM Performed by: Elisha PonderMurray, Truxton Stupka B, PA-C Authorized by: Elisha PonderMurray, Kumar Falwell B, PA-C   Consent:    Consent obtained:  Verbal   Consent given by:  Patient   Risks discussed:  Infection and pain Anesthesia (see MAR for exact dosages):    Anesthesia method:  Nerve block   Block needle gauge:  25 G   Block anesthetic:  Lidocaine 1% w/o epi   Block injection procedure:  Anatomic landmarks identified, introduced needle and incremental injection   Block outcome:  Anesthesia achieved Laceration details:    Location:  Finger   Finger location:  R small finger   Length (cm):  2 Repair type:    Repair type:  Simple Pre-procedure details:    Preparation:  Patient was prepped and draped in usual sterile fashion and imaging obtained to evaluate for foreign bodies Exploration:    Hemostasis achieved with:  Direct pressure   Contaminated: no   Treatment:    Area cleansed with:  Betadine   Amount of cleaning:  Standard   Irrigation solution:  Sterile saline   Irrigation volume:  1000 ml   Irrigation method:  Syringe Skin repair:    Repair method:  Sutures   Suture size:  4-0   Suture material:  Nylon   Suture technique:  Simple interrupted Approximation:    Approximation:  Close   Vermilion border: well-aligned   Post-procedure details:    Dressing:  Non-adherent dressing   Patient tolerance of procedure:   Tolerated well, no immediate complications .Marland Kitchen.Laceration Repair Date/Time: 08/09/2017 5:12 PM Performed by: Elisha PonderMurray, Aftyn Nott B, PA-C Authorized by: Elisha PonderMurray, Albirda Shiel B, PA-C   Consent:    Consent obtained:  Verbal   Consent given by:  Patient   Risks discussed:  Poor cosmetic result and need for additional repair Anesthesia (see MAR for exact dosages):    Anesthesia method:  None Laceration details:    Location:  Face   Face location:  Nose   Wound length (cm): 2 circular avulsion injuries with thin overlying skin flap. Repair type:    Repair type:  Simple Exploration:    Hemostasis achieved with:  Direct pressure Treatment:    Amount of cleaning:  Standard   Irrigation solution:  Sterile saline Skin repair:    Repair method:  Steri-Strips Approximation:    Approximation:  Close   Vermilion border: well-aligned   Post-procedure details:    Dressing:  Non-adherent dressing   Patient tolerance of procedure:  Tolerated well, no immediate complications         Delia ChimesMurray, Sarayu Prevost B, PA-C 08/09/17 1714    Rolan BuccoBelfi, Melanie, MD 08/10/17 [phone]

## 2017-08-09 NOTE — ED Triage Notes (Signed)
Pt tripped/fell during race-facial injuries and right hand injuries noted-denies LOC-NAD-steady gait

## 2017-08-09 NOTE — ED Provider Notes (Signed)
[facility] EMERGENCY DEPARTMENT Provider Note   CSN: [id number] Arrival date & time: 08/09/17  1105     History   Chief Complaint Chief Complaint  Patient presents with  . Fall    HPI Michele Maddox is a 71 y.o. female.  Patient is a 71 year old female with a history of hypertension and hyperlipidemia facial injuries after a fall.  She was running in a Thanksgiving race and tripped in a pothole and fell forward onto her face.  There is no loss of consciousness.  She has had some bleeding from a wound on her nose and has a bump on her head.  She also complains of an injury to her right fifth finger.  She has some bruising on her knees but does not complain of underlying tenderness.  She is able to ambulate without difficulty.  She denies any neck or back pain.  She denies any other injuries from the fall.  Her last tetanus shot was 3 years ago.      Past Medical History:  Diagnosis Date  . High cholesterol   . HOH (hard of hearing)   . Hypertension     Patient Active Problem List   Diagnosis Date Noted  . Chronic pain of right ankle 03/09/2016  . Iron deficiency anemia secondary to inadequate dietary iron intake 02/21/2016  . Encounter for screening colonoscopy 11/03/2015  . Overweight 05/18/2015  . Postmenopausal disorder 02/18/2015  . Vitamin D deficiency 02/05/2015  . Benign essential hypertension 01/19/2015  . Hypokalemia 01/19/2015  . GERD (gastroesophageal reflux disease) 12/15/2014  . Pure hypercholesterolemia 12/15/2014    Past Surgical History:  Procedure Laterality Date  . ABDOMINAL HYSTERECTOMY    . COCHLEAR IMPLANT      OB History    No data available       Home Medications    Prior to Admission medications   Medication Sig Start Date End Date Taking? Authorizing Provider  calcium citrate-vitamin D 500-500 MG-UNIT chewable tablet Chew by mouth.    [provider]  estradiol (ESTRACE) 1 MG tablet Take 1 mg by mouth daily.     [provider]  omeprazole (PRILOSEC) 40 MG capsule Take 40 mg by mouth daily.    [provider]  Potassium Bicarb-Citric Acid (EFFER-K) 10 MEQ TBEF Take 1 tablet by mouth daily.    [provider]  pravastatin (PRAVACHOL) 20 MG tablet Take 20 mg by mouth daily.    [provider]  triamterene-hydrochlorothiazide (DYAZIDE) 37.5-25 MG capsule Take 1 capsule by mouth daily.    [provider]    Family History No family history on file.  Social History Social History   Tobacco Use  . Smoking status: Never Smoker  . Smokeless tobacco: Never Used  Substance Use Topics  . Alcohol use: No    Frequency: Never  . Drug use: No     Allergies   Sulfa antibiotics   Review of Systems Review of Systems  Constitutional: Negative for activity change, appetite change and fever.  HENT: Positive for facial swelling. Negative for dental problem, nosebleeds and trouble swallowing.   Eyes: Negative for pain and visual disturbance.  Respiratory: Negative for shortness of breath.   Cardiovascular: Negative for chest pain.  Gastrointestinal: Negative for abdominal pain, nausea and vomiting.  Genitourinary: Negative for dysuria and hematuria.  Musculoskeletal: Positive for myalgias. Negative for arthralgias, back pain, joint swelling and neck pain.  Skin: Positive for wound.  Neurological: Negative for weakness, numbness and  headaches.  Psychiatric/Behavioral: Negative for confusion.     Physical Exam Updated Vital Signs BP (!) 152/64 (BP Location: Left Arm)   Pulse 66   Temp 97.7 F (36.5 C) (Oral)   Resp 18   Ht 5\' 3"  (1.6 m)   Wt 77.1 kg (170 lb)   SpO2 98%   BMI 30.11 kg/m   Physical Exam  Constitutional: She is oriented to person, place, and time. She appears well-developed and well-nourished.  HENT:  Head: Normocephalic.  Nose: Nose normal.  Patient has 2 small lacerations to the bridge of her nose.  There is no active bleeding.   There these are avulsion type lacerations and the underlying tissue is dusky in color.  There is some swelling to the nose.  There is no septal hematoma.  There is a hematoma to the right forehead.  Eyes: Conjunctivae are normal. Pupils are equal, round, and reactive to light.  Neck:  No pain to the cervical, thoracic, or LS spine.  No step-offs or deformities noted  Cardiovascular: Normal rate and regular rhythm.  No murmur heard. No evidence of external trauma to the chest or abdomen  Pulmonary/Chest: Effort normal and breath sounds normal. No respiratory distress. She has no wheezes. She exhibits no tenderness.  Abdominal: Soft. Bowel sounds are normal. She exhibits no distension. There is no tenderness.  Musculoskeletal: Normal range of motion.  Patient has a 1 cm laceration to the dorsal surface of the right fifth finger.  She has full range of motion of the finger.  She has normal sensation distally.  Normal motor function distally.  Capillary refill is less than 2 distally.  She has some mild swelling to the finger as well.  She has some ecchymosis to her knees but there is no underlying bony tenderness.  No other pain on palpation or range of motion of the extremities  Neurological: She is alert and oriented to person, place, and time.  Skin: Skin is warm and dry. Capillary refill takes less than 2 seconds.  Psychiatric: She has a normal mood and affect.  Vitals reviewed.    ED Treatments / Results  Labs (all labs ordered are listed, but only abnormal results are displayed) Labs Reviewed - No data to display  EKG  EKG Interpretation None       Radiology Ct Head Wo Contrast  Result Date: 08/09/2017 CLINICAL DATA:  Fall. EXAM: CT HEAD WITHOUT CONTRAST CT CERVICAL SPINE WITHOUT CONTRAST TECHNIQUE: Multidetector CT imaging of the head and cervical spine was performed following the standard protocol without intravenous contrast. Multiplanar CT image reconstructions of the  cervical spine were also generated. COMPARISON:  None. FINDINGS: CT HEAD FINDINGS Brain: Mild atrophy. Ventricle size normal. Negative for acute infarct, hemorrhage, or mass lesion. Vascular: Negative for hyperdense vessel Skull: Negative for fracture Sinuses/Orbits: Cochlear implant on the right. Paranasal sinuses clear Other: Mild  right frontal scalp hematoma CT MAXILLOFACIAL FINDINGS Negative for facial fracture.  No fracture of the orbit or mandible. Paranasal sinuses clear.  Right cochlear implant noted Mild scalp hematoma right frontal region. IMPRESSION: No acute intracranial abnormality Negative for facial fracture Electronically Signed   By: Marlan Palauharles  Clark M.D.   On: 08/09/2017 12:40   Dg Finger Little Right  Result Date: 08/09/2017 CLINICAL DATA:  Pain following fall EXAM: RIGHT FIFTH FINGER 2+V COMPARISON:  None. FINDINGS: Frontal, oblique, and lateral views were obtained. There is no fracture or dislocation. Joint spaces appear normal. No erosive change. IMPRESSION: No fracture or  dislocation.  No evident arthropathy. Electronically Signed   By: Bretta Bang III M.D.   On: 08/09/2017 12:49   Ct Maxillofacial Wo Cm  Result Date: 08/09/2017 CLINICAL DATA:  Fall. EXAM: CT HEAD WITHOUT CONTRAST CT CERVICAL SPINE WITHOUT CONTRAST TECHNIQUE: Multidetector CT imaging of the head and cervical spine was performed following the standard protocol without intravenous contrast. Multiplanar CT image reconstructions of the cervical spine were also generated. COMPARISON:  None. FINDINGS: CT HEAD FINDINGS Brain: Mild atrophy. Ventricle size normal. Negative for acute infarct, hemorrhage, or mass lesion. Vascular: Negative for hyperdense vessel Skull: Negative for fracture Sinuses/Orbits: Cochlear implant on the right. Paranasal sinuses clear Other: Mild  right frontal scalp hematoma CT MAXILLOFACIAL FINDINGS Negative for facial fracture.  No fracture of the orbit or mandible. Paranasal sinuses clear.   Right cochlear implant noted Mild scalp hematoma right frontal region. IMPRESSION: No acute intracranial abnormality Negative for facial fracture Electronically Signed   By: Marlan Palau M.D.   On: 08/09/2017 12:40    Procedures Procedures (including critical care time)  Medications Ordered in ED Medications  lidocaine (PF) (XYLOCAINE) 1 % injection 30 mL (not administered)     Initial Impression / Assessment and Plan / ED Course  I have reviewed the triage vital signs and the nursing notes.  Pertinent labs & imaging results that were available during my care of the patient were reviewed by me and considered in my medical decision making (see chart for details).     Patient is a 71 year old female who presents after mechanical fall.  She has facial contusions.  There is no underlying fracture.  No intracranial hemorrhage.  She does not have any spinal tenderness.  She has a laceration to her right fifth finger without underlying fracture.  Her tetanus shot is up-to-date.  Her wounds were repaired by the APP.  She was discharged with wound care instructions.  She was advised to follow-up with her PCP in 1 week for suture removal.  Return precautions and head injury precautions were given.  Final Clinical Impressions(s) / ED Diagnoses   Final diagnoses:  Fall, initial encounter  Contusion of face, initial encounter  Laceration of right little finger without foreign body without damage to nail, initial encounter    ED Discharge Orders    None       Rolan Bucco, MD 08/09/17 1503

## 2017-08-22 ENCOUNTER — Other Ambulatory Visit: Payer: Self-pay | Admitting: Rehabilitation

## 2017-08-22 DIAGNOSIS — G8929 Other chronic pain: Secondary | ICD-10-CM

## 2017-08-22 DIAGNOSIS — M5440 Lumbago with sciatica, unspecified side: Principal | ICD-10-CM

## 2017-09-06 ENCOUNTER — Ambulatory Visit
Admission: RE | Admit: 2017-09-06 | Discharge: 2017-09-06 | Disposition: A | Discharge status: Home / Self Care | Payer: Medicare Other | Source: Ambulatory Visit | Attending: Rehabilitation | Admitting: Rehabilitation

## 2017-09-06 DIAGNOSIS — M5440 Lumbago with sciatica, unspecified side: Principal | ICD-10-CM

## 2017-09-06 DIAGNOSIS — G8929 Other chronic pain: Secondary | ICD-10-CM

## 2017-09-06 MED ORDER — ONDANSETRON HCL 4 MG/2ML IJ SOLN: 4.0000 mg | Freq: Four times a day (QID) | INTRAMUSCULAR | Status: DC | PRN

## 2017-09-06 MED ORDER — IOPAMIDOL (ISOVUE-M 200) INJECTION 41%
15.0000 mL | Freq: Once | INTRAMUSCULAR | Status: AC
  Administered 2017-09-06: 15 mL via INTRATHECAL

## 2017-09-06 MED ORDER — DIAZEPAM 5 MG PO TABS
5.0000 mg | ORAL_TABLET | Freq: Once | ORAL | Status: AC
  Administered 2017-09-06: 5 mg via ORAL

## 2017-09-06 NOTE — Discharge Instructions (Signed)

## 2020-04-05 ENCOUNTER — Encounter: Payer: Self-pay | Admitting: Medical

## 2020-04-05 ENCOUNTER — Other Ambulatory Visit: Payer: Self-pay

## 2020-04-05 ENCOUNTER — Ambulatory Visit (INDEPENDENT_AMBULATORY_CARE_PROVIDER_SITE_OTHER): Payer: Medicare Other | Admitting: Medical

## 2020-04-05 VITALS — BP 108/60 | HR 70 | Resp 18 | Ht 63.0 in | Wt 180.0 lb

## 2020-04-05 DIAGNOSIS — K219 Gastro-esophageal reflux disease without esophagitis: Secondary | ICD-10-CM

## 2020-04-05 DIAGNOSIS — M542 Cervicalgia: Secondary | ICD-10-CM

## 2020-04-05 DIAGNOSIS — I10 Essential (primary) hypertension: Secondary | ICD-10-CM

## 2020-04-05 DIAGNOSIS — R7989 Other specified abnormal findings of blood chemistry: Secondary | ICD-10-CM

## 2020-04-05 DIAGNOSIS — E785 Hyperlipidemia, unspecified: Secondary | ICD-10-CM | POA: Diagnosis not present

## 2020-04-05 DIAGNOSIS — Z862 Personal history of diseases of the blood and blood-forming organs and certain disorders involving the immune mechanism: Secondary | ICD-10-CM

## 2020-04-05 DIAGNOSIS — Z1231 Encounter for screening mammogram for malignant neoplasm of breast: Secondary | ICD-10-CM

## 2020-04-05 MED ORDER — CYCLOBENZAPRINE HCL 5 MG PO TABS: ORAL_TABLET | ORAL | 0 refills | Status: AC

## 2020-04-05 NOTE — Patient Instructions (Addendum)
You do have history of hypertension but your blood pressure is well controlled presently.  You recommend checking blood pressure on occasion at home.  Notify us of any high blood pressures or if blood pressures are trending downward.  For history of high cholesterol, continue current statin medication.  Recent lipid in the computer looks close to tightly controlled.  For history of low vitamin D, did place vitamin D order today.  Also want to get metabolic panel to check your potassium level in light of diuretic use and recent weight loss while you had brief loose stool/diarrhea.  Glad to hear that your diarrhea has resolved.  For history of anemia you had normal CBC 1 month ago.  I do want you to get an iron level.  I do want to check your iron level today.  For history of GERD continue omeprazole.  I did go ahead and place screening mammogram order.  You could call the med center and asked to speak to radiology to get scheduled around November when you are due.  For neck pain, benefit vs risk of low dose flexeril discussed. Rx advisement given. Only use at night. Caution on ambulating to bathroom. Never use 10 mg as excessive dose for your age group.   Follow-up date to be determined after lab review.  Usually in your age group frequency of follow-up is between every 3 to 6 months.

## 2020-04-05 NOTE — Progress Notes (Addendum)
Subjective:    Patient ID: Michele Maddox, female    DOB: 08/29/1946, 74 y.o.   MRN: [id number]  HPI  Pt in for first time.  New pt. Walks 3 miles a day 5 days a week. Pt states her diet is good. But bland food where she lives. Remote smoker. Quit in 1983.Only 4 year smoker. Rare glass of wine.   Pt htn, high, low vit D cholesterol and gerd.  Pt had some labs done recently with [facility]. Labs done in June. All labs recently looked. On and off in the past iron deficiency.   Pt is up to date on her colonosocpy. March 6 ,2017. Was normal.  Bone density was in 2017.   Pt states she is up to date on meds.  Pt has hx of neck pain and shoulders. No pain in neck presently.  IMPRESSION: Severe multilevel facet arthropathy is the dominant finding.  With regard to LEFT-sided neck discomfort, foraminal narrowing at C3-4, C4-5, and C5-6, all on the LEFT, could be contributory.  Multifactorial spinal stenosis at C3-4, with slight cord flattening on the LEFT, canal diameter 7 mm. This relates to calcific disc material/osseous spurring as well as posterior element hypertrophy. See discussion above.  Pt states some rotator cuff pain in past.   PT was recommending flexeril.   Pt has had covid vaccine.   Pt mood has been controlled. Hx of depression but her mood is controlled.  Prior dexascan normal per pt. Due in 2022 per pt.   Review of Systems  Constitutional: Negative for chills, fatigue and fever.  Respiratory: Negative for cough, chest tightness, shortness of breath and wheezing.   Cardiovascular: Negative for chest pain and palpitations.  Gastrointestinal: Negative for abdominal pain, constipation, nausea and vomiting.  Musculoskeletal: Positive for neck pain. Negative for arthralgias, back pain and neck stiffness.  Skin: Negative for rash.  Neurological: Negative for dizziness and headaches.  Hematological: Negative for adenopathy. Does not bruise/bleed easily.    Psychiatric/Behavioral: Negative for behavioral problems.    Past Medical History:  Diagnosis Date  . High cholesterol   . HOH (hard of hearing)   . Hypertension      Social History   Socioeconomic History  . Marital status: Married    Spouse name: Not on file  . Number of children: Not on file  . Years of education: Not on file  . Highest education level: Not on file  Occupational History  . Not on file  Tobacco Use  . Smoking status: Never Smoker  . Smokeless tobacco: Never Used  Substance and Sexual Activity  . Alcohol use: No  . Drug use: No  . Sexual activity: Not on file  Other Topics Concern  . Not on file  Social History Narrative  . Not on file   Social Determinants of Health   Financial Resource Strain:   . Difficulty of Paying Living Expenses:   Food Insecurity:   . Worried About Programme researcher, broadcasting/film/video in the Last Year:   . Barista in the Last Year:   Transportation Needs:   . Freight forwarder (Medical):   Marland Kitchen Lack of Transportation (Non-Medical):   Physical Activity:   . Days of Exercise per Week:   . Minutes of Exercise per Session:   Stress:   . Feeling of Stress :   Social Connections:   . Frequency of Communication with Friends and Family:   . Frequency of Social Gatherings with Friends and  Family:   . Attends Religious Services:   . Active Member of Clubs or Organizations:   . Attends Banker Meetings:   Marland Kitchen Marital Status:   Intimate Partner Violence:   . Fear of Current or Ex-Partner:   . Emotionally Abused:   Marland Kitchen Physically Abused:   . Sexually Abused:     Past Surgical History:  Procedure Laterality Date  . ABDOMINAL HYSTERECTOMY    . COCHLEAR IMPLANT      No family history on file.  Allergies  Allergen Reactions  . Sulfa Antibiotics     As a child    Current Outpatient Medications on File Prior to Visit  Medication Sig Dispense Refill  . calcium-vitamin D (OSCAL WITH D) 250-125 MG-UNIT tablet Take 1  tablet by mouth daily.    . calcium carbonate (OS-CAL) 1250 (500 Ca) MG chewable tablet Chew by mouth. (Patient not taking: Reported on 04/05/2020)    . estradiol (ESTRACE) 1 MG tablet Take 1 mg by mouth daily.    Marland Kitchen glycopyrrolate (ROBINUL) 2 MG tablet Take 2 mg by mouth 3 (three) times daily.    Marland Kitchen omeprazole (PRILOSEC) 40 MG capsule Take 40 mg by mouth daily.    . Potassium Bicarb-Citric Acid (EFFER-K) 10 MEQ TBEF Take 1 tablet by mouth daily. (Patient not taking: Reported on 04/05/2020)    . potassium chloride (MICRO-K) 10 MEQ CR capsule Take by mouth.    . pravastatin (PRAVACHOL) 20 MG tablet Take 20 mg by mouth daily.    Marland Kitchen triamterene-hydrochlorothiazide (DYAZIDE) 37.5-25 MG capsule Take 1 capsule by mouth daily.     No current facility-administered medications on file prior to visit.    BP 108/60 (BP Location: Left Arm, Patient Position: Sitting, Cuff Size: Large)   Pulse 70   Resp 18   Ht 5\' 3"  (1.6 m)   Wt 180 lb (81.6 kg)   SpO2 98%   BMI 31.89 kg/m       Objective:   Physical Exam   General Mental Status- Alert. General Appearance- Not in acute distress.   Skin General: Color- Normal Color. Moisture- Normal Moisture.  Neck Carotid Arteries- Normal color. Moisture- Normal Moisture. No carotid bruits. No JVD. No mid spinal pain presently or pain on palpation trapezius during exam  Chest and Lung Exam Auscultation: Breath Sounds:-Normal.  Cardiovascular Auscultation:Rythm- Regular. Murmurs & Other Heart Sounds:Auscultation of the heart reveals- No Murmurs.  Abdomen Inspection:-Inspeection Normal. Palpation/Percussion:Note:No mass. Palpation and Percussion of the abdomen reveal- Non Tender, Non Distended + BS, no rebound or guarding.    Neurologic Cranial Nerve exam:- CN III-XII intact(No nystagmus), symmetric smile. Strength:- 5/5 equal and symmetric strength both upper and lower extremities.     Assessment & Plan:  You do have history of hypertension but  your blood pressure is well controlled presently.  You recommend checking blood pressure on occasion at home.  Notify of any high blood pressures or if blood pressures are trending downward.  For history of high cholesterol, continue current statin medication.  Recent lipid in the computer looks close to tightly controlled.  For history of low vitamin D, did place vitamin D order today.  Also want to get metabolic panel to check your potassium level in light of diuretic use and recent weight loss while you had brief loose stool/diarrhea.  Glad to hear that your diarrhea has resolved.  For history of anemia you had normal CBC 1 month ago.  I do want you to get an  iron level.  I do want to check your iron level today.  For history of GERD continue omeprazole.  I did go ahead and place screening mammogram order.  You could call the med center and asked to speak to radiology to get scheduled around November when you are due.  For neck pain, benefit vs risk of low dose flexeril discussed. Rx advisement given. Only use at night. Caution on ambulating to bathroom. Never use 10 mg as excessive dose for your age group.  For neck pain, benefit vs risk of low dose flexeril discussed. Rx advisement given. Only use at night. Caution on ambulating to bathroom. Never use 10 mg as excessive dose for your age group.  Follow-up date to be determined after lab review.  Usually in your age group frequency of follow-up is between every 3 to 6 months.  Time spent with patient today was 39  minutes which consisted of chart revdiew, discussing diagnosis, work up, treatment and documentation.  Esperanza Richters, PA-C

## 2020-04-06 LAB — COMPREHENSIVE METABOLIC PANEL
ALT: 9 U/L (ref 0–35)
AST: 11 U/L (ref 0–37)
Albumin: 4.2 g/dL (ref 3.5–5.2)
Alkaline Phosphatase: 74 U/L (ref 39–117)
BUN: 19 mg/dL (ref 6–23)
CO2: 29 mEq/L (ref 19–32)
Calcium: 10.1 mg/dL (ref 8.4–10.5)
Chloride: 100 mEq/L (ref 96–112)
Creatinine, Ser: 1.01 mg/dL (ref 0.40–1.20)
GFR: 53.94 mL/min — ABNORMAL LOW (ref >60.00)
Glucose, Bld: 126 mg/dL — ABNORMAL HIGH (ref 70–99)
Potassium: 3.5 mEq/L (ref 3.5–5.1)
Sodium: 137 mEq/L (ref 135–145)
Total Bilirubin: 0.4 mg/dL (ref 0.2–1.2)
Total Protein: 6.3 g/dL (ref 6.0–8.3)

## 2020-04-08 LAB — VITAMIN D 1,25 DIHYDROXY
Vitamin D 1, 25 (OH)2 Total: 47 pg/mL (ref 18–72)
Vitamin D2 1, 25 (OH)2: <8 pg/mL
Vitamin D3 1, 25 (OH)2: 47 pg/mL

## 2020-04-08 LAB — IRON, TOTAL/TOTAL IRON BINDING CAP: Iron: 71 ug/dL (ref 45–160)

## 2020-04-09 ENCOUNTER — Encounter: Payer: Self-pay | Admitting: Medical

## 2020-04-13 ENCOUNTER — Ambulatory Visit (INDEPENDENT_AMBULATORY_CARE_PROVIDER_SITE_OTHER): Payer: Medicare Other | Admitting: Medical

## 2020-04-13 ENCOUNTER — Encounter: Payer: Self-pay | Admitting: Medical

## 2020-04-13 ENCOUNTER — Other Ambulatory Visit: Payer: Self-pay

## 2020-04-13 VITALS — BP 112/58 | HR 77 | Temp 98.6°F | Resp 20 | Ht 63.0 in | Wt 179.2 lb

## 2020-04-13 DIAGNOSIS — R21 Rash and other nonspecific skin eruption: Secondary | ICD-10-CM

## 2020-04-13 DIAGNOSIS — R195 Other fecal abnormalities: Secondary | ICD-10-CM

## 2020-04-13 MED ORDER — FAMCICLOVIR 500 MG PO TABS
500.0000 mg | ORAL_TABLET | Freq: Three times a day (TID) | ORAL | 0 refills | Status: DC
Start: 2020-04-13 — End: 2020-06-23

## 2020-04-13 NOTE — Addendum Note (Signed)
Addended by: Mervin Kung A on: 04/13/2020 03:42 PM   Modules accepted: Orders

## 2020-04-13 NOTE — Patient Instructions (Addendum)
You have recent loose watery stool couple of times a day for the past 2 weeks.  We will get stool culture , O&P and C. Difficile studies. Studies  will come back typically over the next 3 to 7 days.  Will notify on results x-ray come in.  After you turn in a stool sample can use Imodium 1-2 times daily if needed.  Continue high-fiber diet.  Could increase probiotics rich foods in your diet.  If studies negative and symptoms persist then consider referral to GI.  Recent right breast rash which by exam and history seem consistent with recurrent shingles, I did prescribe Famvir.  Want to know that the rash is clearing in the next 7 to 10 days.  If rash persist despite treatment then would need further work-up.  Follow-up in 10 to 14 days or sooner if needed.

## 2020-04-13 NOTE — Progress Notes (Signed)
Subjective:    Patient ID: Michele Maddox, female    DOB: 01/09/1946, 74 y.o.   MRN: [id number]  HPI  Pt in with diarrhea/loose stools for past 2 weeks. No antibiotic use recently. No fever, no chills or sweats. 1-2 times a day. Volume is gradually improving. No prior diarrhea or constipation.   Before recent loose stools she has daily softer side stool.  Rt breast small bumps that popped up last week. No pain. Pt has had shingle vaccine in the past. Pt states initial appearance recently small vesicles. In the past had shingles 10 years ago.   Review of Systems  Constitutional: Negative for chills, fatigue and fever.  Respiratory: Negative for cough, chest tightness, shortness of breath and wheezing.   Cardiovascular: Negative for chest pain and palpitations.  Gastrointestinal: Negative for abdominal pain.  Skin:       Shingles. Rt breast early rash.  Hematological: Negative for adenopathy. Does not bruise/bleed easily.  Psychiatric/Behavioral: Negative for behavioral problems and sleep disturbance. The patient is not nervous/anxious.     Past Medical History:  Diagnosis Date  . GERD (gastroesophageal reflux disease)   . High cholesterol   . HOH (hard of hearing)   . Hypertension      Social History   Socioeconomic History  . Marital status: Married    Spouse name: Not on file  . Number of children: Not on file  . Years of education: Not on file  . Highest education level: Not on file  Occupational History  . Not on file  Tobacco Use  . Smoking status: Former Smoker    Packs/day: 0.25    Years: 4.00    Pack years: 1.00    Quit date: 04/05/1982    Years since quitting: 38.0  . Smokeless tobacco: Never Used  Substance and Sexual Activity  . Alcohol use: Yes    Comment: describes extreme rare alcohol use. rare glass of wine.  . Drug use: No  . Sexual activity: Not on file  Other Topics Concern  . Not on file  Social History Narrative  . Not on file   Social  Determinants of Health   Financial Resource Strain:   . Difficulty of Paying Living Expenses:   Food Insecurity:   . Worried About Programme researcher, broadcasting/film/video in the Last Year:   . Barista in the Last Year:   Transportation Needs:   . Freight forwarder (Medical):   Marland Kitchen Lack of Transportation (Non-Medical):   Physical Activity:   . Days of Exercise per Week:   . Minutes of Exercise per Session:   Stress:   . Feeling of Stress :   Social Connections:   . Frequency of Communication with Friends and Family:   . Frequency of Social Gatherings with Friends and Family:   . Attends Religious Services:   . Active Member of Clubs or Organizations:   . Attends Banker Meetings:   Marland Kitchen Marital Status:   Intimate Partner Violence:   . Fear of Current or Ex-Partner:   . Emotionally Abused:   Marland Kitchen Physically Abused:   . Sexually Abused:     Past Surgical History:  Procedure Laterality Date  . ABDOMINAL HYSTERECTOMY    . COCHLEAR IMPLANT      No family history on file.  Allergies  Allergen Reactions  . Sulfa Antibiotics     As a child    Current Outpatient Medications on File Prior to Visit  Medication Sig Dispense Refill  . calcium carbonate (OS-CAL) 1250 (500 Ca) MG chewable tablet Chew by mouth. (Patient not taking: Reported on 04/05/2020)    . calcium-vitamin D (OSCAL WITH D) 250-125 MG-UNIT tablet Take 1 tablet by mouth daily.    . cyclobenzaprine (FLEXERIL) 5 MG tablet 1 tab po q hs as needed neck pain 10 tablet 0  . estradiol (ESTRACE) 1 MG tablet Take 1 mg by mouth daily.    Marland Kitchen glycopyrrolate (ROBINUL) 2 MG tablet Take 2 mg by mouth 3 (three) times daily.    Marland Kitchen omeprazole (PRILOSEC) 40 MG capsule Take 40 mg by mouth daily.    . Potassium Bicarb-Citric Acid (EFFER-K) 10 MEQ TBEF Take 1 tablet by mouth daily. (Patient not taking: Reported on 04/05/2020)    . potassium chloride (MICRO-K) 10 MEQ CR capsule Take by mouth.    . pravastatin (PRAVACHOL) 20 MG tablet Take 20  mg by mouth daily.    Marland Kitchen triamterene-hydrochlorothiazide (DYAZIDE) 37.5-25 MG capsule Take 1 capsule by mouth daily.     No current facility-administered medications on file prior to visit.    BP (!) 112/58 (BP Location: Right Arm, Patient Position: Sitting, Cuff Size: Large)   Pulse 77   Temp 98.6 F (37 C) (Oral)   Resp 20   Ht 5\' 3"  (1.6 m)   Wt 179 lb 3.2 oz (81.3 kg)   SpO2 96%   BMI 31.74 kg/m       Objective:   Physical Exam  General- No acute distress. Pleasant patient. Neck- Full range of motion, no jvd Lungs- Clear, even and unlabored. Heart- regular rate and rhythm. Neurologic- CNII- XII grossly intact.  Abdomen-soft, nondistended, positive bowel sounds, no rebound or guarding but does have some faint slight tenderness to palpation on both sides of the umbilicus region.  No umbilical hernia felt.  Derm-right upper breast area she has small red bumps which looks like a previous ruptured vesicles.  Lesions number about 5-7.  No discharge noted.       Assessment & Plan:  You have recent loose watery stool couple of times a day for the past 2 weeks.  We will get stool culture , O&P and C. Difficile studies.  Otherwise will come back typically over the next 3 to 7 days.  Will notify on results x-ray come in.  After you turn in a stool sample can use Imodium 1-2 times daily if needed.  Continue high-fiber diet.  Could increase probiotics rich foods in your diet.  If studies negative and symptoms persist then consider referral to GI.  Recent right breast rash which by exam and history seem consistent with recurrent shingles, I did prescribe Famvir.  Want to know that the rash is clearing in the next 7 to 10 days.  If rash persist despite treatment then would need further work-up.  Follow-up in 10 to 14 days or sooner if needed.  , PA-C   Time spent with patient today was 30  minutes which consisted of chart review, discussing diagnoses, work up, treatment  and documentation.

## 2020-04-15 LAB — CLOSTRIDIUM DIFFICILE BY PCR: Toxigenic C. Difficile by PCR: NEGATIVE

## 2020-04-22 ENCOUNTER — Telehealth: Payer: Self-pay | Admitting: Medical

## 2020-04-22 LAB — STOOL CULTURE
MICRO NUMBER:: 10754572
MICRO NUMBER:: 10754573
MICRO NUMBER:: 10754575
SHIGA RESULT:: NOT DETECTED
SPECIMEN QUALITY:: ADEQUATE
SPECIMEN QUALITY:: ADEQUATE
SPECIMEN QUALITY:: ADEQUATE

## 2020-04-22 LAB — OVA AND PARASITE EXAMINATION
CONCENTRATE RESULT:: NONE SEEN
MICRO NUMBER:: 10754574
SPECIMEN QUALITY:: ADEQUATE
TRICHROME RESULT:: NONE SEEN

## 2020-04-22 MED ORDER — NYSTATIN 100000 UNIT/GM EX CREA: TOPICAL_CREAM | CUTANEOUS | 0 refills | Status: DC

## 2020-04-22 NOTE — Telephone Encounter (Signed)
nystatin sent to pt pharmacy.

## 2020-04-30 ENCOUNTER — Encounter: Payer: Self-pay | Admitting: Medical

## 2020-05-02 ENCOUNTER — Telehealth: Payer: Self-pay | Admitting: Medical

## 2020-05-02 DIAGNOSIS — R195 Other fecal abnormalities: Secondary | ICD-10-CM

## 2020-05-02 NOTE — Telephone Encounter (Signed)
Referral to gi placed. °

## 2020-05-07 ENCOUNTER — Encounter: Payer: Self-pay | Admitting: Gastroenterology

## 2020-05-19 ENCOUNTER — Other Ambulatory Visit: Payer: Self-pay

## 2020-05-19 ENCOUNTER — Ambulatory Visit (INDEPENDENT_AMBULATORY_CARE_PROVIDER_SITE_OTHER): Payer: Medicare Other | Admitting: *Deleted

## 2020-05-19 ENCOUNTER — Encounter: Payer: Self-pay | Admitting: *Deleted

## 2020-05-19 VITALS — BP 124/78 | Wt 170.0 lb

## 2020-05-19 DIAGNOSIS — Z0000 Encounter for general adult medical examination without abnormal findings: Secondary | ICD-10-CM

## 2020-05-19 NOTE — Progress Notes (Addendum)
I connected with Michele Maddox and her husband Michele Maddox today by telephone and verified that I am speaking with the correct person using two identifiers. Location patient: home Location provider: work Persons participating in the virtual visit: patient, Engineer, civil (consulting)nurse.    I discussed the limitations, risks, security and privacy concerns of performing an evaluation and management service by telephone and the availability of in person appointments. I also discussed with the patient that there may be a patient responsible charge related to this service. The patient expressed understanding and verbally consented to this telephonic visit.    Interactive audio and video telecommunications were attempted between this provider and patient, however failed, due to patient having technical difficulties OR patient did not have access to video capability.  We continued and completed visit with audio only.  Some vital signs may be absent or patient reported.    Subjective:   Michele Maddox is a 74 y.o. female who presents for an Initial Medicare Annual Wellness Visit.  Review of Systems    Cardiac Risk Factors include: advanced age (>4355men, 47>65 women);hypertension     Objective:    Today's Vitals   05/19/20 1103  BP: 124/78  Weight: 170 lb (77.1 kg)   Body mass index is 30.11 kg/m.  Advanced Directives 05/19/2020  Does Patient Have a Medical Advance Directive? Yes  Type of Estate agentAdvance Directive Healthcare Power of LovingAttorney;Living will  Does patient want to make changes to medical advance directive? No - Patient declined  Copy of Healthcare Power of Attorney in Chart? No - copy requested    Current Medications (verified) Outpatient Encounter Medications as of 05/19/2020  Medication Sig  . calcium carbonate (OS-CAL) 1250 (500 Ca) MG chewable tablet Chew by mouth.   . calcium-vitamin D (OSCAL WITH D) 250-125 MG-UNIT tablet Take 1 tablet by mouth daily.  . cyclobenzaprine (FLEXERIL) 5 MG tablet 1 tab po q hs as  needed neck pain  . famciclovir (FAMVIR) 500 MG tablet Take 1 tablet (500 mg total) by mouth 3 (three) times daily.  Marland Kitchen. glycopyrrolate (ROBINUL) 2 MG tablet Take 2 mg by mouth 3 (three) times daily.  Marland Kitchen. nystatin cream (MYCOSTATIN) Thin film once daily  . omeprazole (PRILOSEC) 40 MG capsule Take 40 mg by mouth daily.  . Potassium Bicarb-Citric Acid (EFFER-K) 10 MEQ TBEF Take 1 tablet by mouth daily.   . pravastatin (PRAVACHOL) 20 MG tablet Take 20 mg by mouth daily.  Marland Kitchen. triamterene-hydrochlorothiazide (DYAZIDE) 37.5-25 MG capsule Take 1 capsule by mouth daily.  . potassium chloride (MICRO-K) 10 MEQ CR capsule Take by mouth.  . [DISCONTINUED] estradiol (ESTRACE) 1 MG tablet Take 1 mg by mouth daily.   No facility-administered encounter medications on file as of 05/19/2020.    Allergies (verified) Sulfa antibiotics   History: Past Medical History:  Diagnosis Date  . GERD (gastroesophageal reflux disease)   . High cholesterol   . HOH (hard of hearing)   . Hypertension    Past Surgical History:  Procedure Laterality Date  . ABDOMINAL HYSTERECTOMY    . COCHLEAR IMPLANT     History reviewed. No pertinent family history. Social History   Socioeconomic History  . Marital status: Married    Spouse name: Not on file  . Number of children: Not on file  . Years of education: Not on file  . Highest education level: Not on file  Occupational History  . Not on file  Tobacco Use  . Smoking status: Former Smoker    Packs/day: 0.25  Years: 4.00    Pack years: 1.00    Quit date: 04/05/1982    Years since quitting: 38.1  . Smokeless tobacco: Never Used  Substance and Sexual Activity  . Alcohol use: Yes    Comment: describes extreme rare alcohol use. rare glass of wine.  . Drug use: No  . Sexual activity: Not on file  Other Topics Concern  . Not on file  Social History Narrative  . Not on file   Social Determinants of Health   Financial Resource Strain: Low Risk   . Difficulty of  Paying Living Expenses: Not hard at all  Food Insecurity: No Food Insecurity  . Worried About Programme researcher, broadcasting/film/video in the Last Year: Never true  . Ran Out of Food in the Last Year: Never true  Transportation Needs: No Transportation Needs  . Lack of Transportation (Medical): No  . Lack of Transportation (Non-Medical): No  Physical Activity:   . Days of Exercise per Week: Not on file  . Minutes of Exercise per Session: Not on file  Stress:   . Feeling of Stress : Not on file  Social Connections:   . Frequency of Communication with Friends and Family: Not on file  . Frequency of Social Gatherings with Friends and Family: Not on file  . Attends Religious Services: Not on file  . Active Member of Clubs or Organizations: Not on file  . Attends Banker Meetings: Not on file  . Marital Status: Not on file    Tobacco Counseling Counseling given: Not Answered   Clinical Intake:     Pain : No/denies pain              Activities of Daily Living In your present state of health, do you have any difficulty performing the following activities: 05/19/2020  Hearing? N  Vision? N  Difficulty concentrating or making decisions? N  Walking or climbing stairs? N  Dressing or bathing? N  Doing errands, shopping? N  Preparing Food and eating ? N  Using the Toilet? N  In the past six months, have you accidently leaked urine? N  Do you have problems with loss of bowel control? N  Managing your Medications? N  Managing your Finances? N  Housekeeping or managing your Housekeeping? N  Some recent data might be hidden    Patient Care Team: Saguier, Kateri Mc as PCP - General (Internal Medicine)  Indicate any recent Medical Services you may have received from other than [facility] providers in the past year (date may be approximate).     Assessment:   This is a routine wellness examination for Michele Maddox.   Dietary issues and exercise activities discussed: Current Exercise  Habits: Home exercise routine, Type of exercise: walking, Time (Minutes): 60, Frequency (Times/Week): 7, Weekly Exercise (Minutes/Week): 420, Intensity: Mild, Exercise limited by: None identified Diet (meal preparation, eat out, water intake, caffeinated beverages, dairy products, fruits and vegetables): well balanced       Goals    . Maintain healthy active lifestyle.      Depression Screen PHQ 2/9 Scores 05/19/2020  PHQ - 2 Score 0    Fall Risk Fall Risk  05/19/2020  Falls in the past year? 0  Number falls in past yr: 0  Injury with Fall? 0  Follow up Education provided;Falls prevention discussed    Any stairs in or around the home? Yes  If so, are there any without handrails? No  Home free of loose throw rugs  in walkways, pet beds, electrical cords, etc? Yes  Adequate lighting in your home to reduce risk of falls? Yes   ASSISTIVE DEVICES UTILIZED TO PREVENT FALLS:  Life alert? Yes  Use of a cane, walker or w/c? Yes  Grab bars in the bathroom? Yes  Shower chair or bench in shower? Yes  Elevated toilet seat or a handicapped toilet? Yes     Cognitive Function: Ad8 score reviewed for issues:  Issues making decisions:no  Less interest in hobbies / activities:no  Repeats questions, stories (family complaining):no  Trouble using ordinary gadgets (microwave, computer, phone):no  Forgets the month or year: no  Mismanaging finances: no  Remembering appts:no  Daily problems with thinking and/or memory:no Ad8 score is=0         Immunizations Immunization History  Administered Date(s) Administered  . Influenza, High Dose Seasonal PF 05/28/2019  . PFIZER SARS-COV-2 Vaccination 10/24/2019, 11/14/2019  . Pneumococcal Polysaccharide-23 08/18/2014  . Tdap 01/09/2014  . Zoster 01/06/2011  . Zoster Recombinat (Shingrix) 03/07/2017, 05/07/2017    TDAP status: Up to date Flu Vaccine status: Up to date Pneumococcal vaccine status: Up to date Covid-19 vaccine status:  Completed vaccines  Qualifies for Shingles Vaccine? Yes   Zostavax completed Yes   Shingrix Completed?: Yes  Screening Tests Health Maintenance  Topic Date Due  . Hepatitis C Screening  Never done  . MAMMOGRAM  Never done  . COLONOSCOPY  Never done  . DEXA SCAN  Never done  . INFLUENZA VACCINE  04/18/2020  . TETANUS/TDAP  01/10/2024  . COVID-19 Vaccine  Completed  . PNA vac Low Risk Adult  Completed    Health Maintenance  Health Maintenance Due  Topic Date Due  . Hepatitis C Screening  Never done  . MAMMOGRAM  Never done  . COLONOSCOPY  Never done  . DEXA SCAN  Never done  . INFLUENZA VACCINE  04/18/2020    CCS- completed 2015 per pt Mammogram scheduled 07/08/20. Pt will discuss dexa scan w/ PCP at next OV.  Lung Cancer Screening: (Low Dose CT Chest recommended if Age 20-80 years, 30 pack-year currently smoking OR have quit w/in 15years.) does not qualify.   Lung Cancer Screening Referral: na  Additional Screening:  Vision Screening: Recommended annual ophthalmology exams for early detection of glaucoma and other disorders of the eye. Is the patient up to date with their annual eye exam?  Yes  Who is the provider or what is the name of the office in which the patient attends annual eye exams? [facility]   Dental Screening: Recommended annual dental exams for proper oral hygiene  Community Resource Referral / Chronic Care Management: CRR required this visit?  No   CCM required this visit?  No      Plan:    Continue to eat heart healthy diet (full of fruits, vegetables, whole grains, lean protein, water--limit salt, fat, and sugar intake) and increase physical activity as tolerated.  Continue doing brain stimulating activities (puzzles, reading, adult coloring books, staying active) to keep memory sharp.   Bring a copy of your living will and/or healthcare power of attorney to your next office visit.  I have personally reviewed and noted the following in  the patient's chart:   . Medical and social history . Use of alcohol, tobacco or illicit drugs  . Current medications and supplements . Functional ability and status . Nutritional status . Physical activity . Advanced directives . List of other physicians . Hospitalizations, surgeries, and ER  visits in previous 12 months . Vitals . Screenings to include cognitive, depression, and falls . Referrals and appointments  In addition, I have reviewed and discussed with patient certain preventive protocols, quality metrics, and best practice recommendations. A written personalized care plan for preventive services as well as general preventive health recommendations were provided to patient.   Due to this being a telephonic visit, the after visit summary with patients personalized plan was offered to patient via mail or my-chart. Patient would like to access on my-chart.   [place], [place]   05/19/2020   Reviewed and Agree with Assessment & plan of RN.  Esperanza Richters, PA-C

## 2020-05-19 NOTE — Patient Instructions (Signed)
Continue to eat heart healthy diet (full of fruits, vegetables, whole grains, lean protein, water--limit salt, fat, and sugar intake) and increase physical activity as tolerated.  Continue doing brain stimulating activities (puzzles, reading, adult coloring books, staying active) to keep memory sharp.   Bring a copy of your living will and/or healthcare power of attorney to your next office visit.   Michele Maddox , Thank you for taking time to come for your Medicare Wellness Visit. I appreciate your ongoing commitment to your health goals. Please review the following plan we discussed and let me know if I can assist you in the future.   These are the goals we discussed: Goals    . Maintain healthy active lifestyle.       This is a list of the screening recommended for you and due dates:  Health Maintenance  Topic Date Due  .  Hepatitis C: One time screening is recommended by Center for Disease Control  (CDC) for  adults born from [birth year] through 1963.   Never done  . Mammogram  Never done  . Colon Cancer Screening  Never done  . DEXA scan (bone density measurement)  Never done  . Flu Shot  04/18/2020  . Tetanus Vaccine  01/10/2024  . COVID-19 Vaccine  Completed  . Pneumonia vaccines  Completed    Preventive Care 50 Years and Older, Female Preventive care refers to lifestyle choices and visits with your health care provider that can promote health and wellness. This includes:  A yearly physical exam. This is also called an annual well check.  Regular dental and eye exams.  Immunizations.  Screening for certain conditions.  Healthy lifestyle choices, such as diet and exercise. What can I expect for my preventive care visit? Physical exam Your health care provider will check:  Height and weight. These may be used to calculate body mass index (BMI), which is a measurement that tells if you are at a healthy weight.  Heart rate and blood pressure.  Your skin for abnormal  spots. Counseling Your health care provider may ask you questions about:  Alcohol, tobacco, and drug use.  Emotional well-being.  Home and relationship well-being.  Sexual activity.  Eating habits.  History of falls.  Memory and ability to understand (cognition).  Work and work Statistician.  Pregnancy and menstrual history. What immunizations do I need?  Influenza (flu) vaccine  This is recommended every year. Tetanus, diphtheria, and pertussis (Tdap) vaccine  You may need a Td booster every 10 years. Varicella (chickenpox) vaccine  You may need this vaccine if you have not already been vaccinated. Zoster (shingles) vaccine  You may need this after age 1. Pneumococcal conjugate (PCV13) vaccine  One dose is recommended after age 34. Pneumococcal polysaccharide (PPSV23) vaccine  One dose is recommended after age 22. Measles, mumps, and rubella (MMR) vaccine  You may need at least one dose of MMR if you were born in 1957 or later. You may also need a second dose. Meningococcal conjugate (MenACWY) vaccine  You may need this if you have certain conditions. Hepatitis A vaccine  You may need this if you have certain conditions or if you travel or work in places where you may be exposed to hepatitis A. Hepatitis B vaccine  You may need this if you have certain conditions or if you travel or work in places where you may be exposed to hepatitis B. Haemophilus influenzae type b (Hib) vaccine  You may need this if you have  certain conditions. You may receive vaccines as individual doses or as more than one vaccine together in one shot (combination vaccines). Talk with your health care provider about the risks and benefits of combination vaccines. What tests do I need? Blood tests  Lipid and cholesterol levels. These may be checked every 5 years, or more frequently depending on your overall health.  Hepatitis C test.  Hepatitis B test. Screening  Lung cancer  screening. You may have this screening every year starting at age 48 if you have a 30-pack-year history of smoking and currently smoke or have quit within the past 15 years.  Colorectal cancer screening. All adults should have this screening starting at age 22 and continuing until age 63. Your health care provider may recommend screening at age 27 if you are at increased risk. You will have tests every 1-10 years, depending on your results and the type of screening test.  Diabetes screening. This is done by checking your blood sugar (glucose) after you have not eaten for a while (fasting). You may have this done every 1-3 years.  Mammogram. This may be done every 1-2 years. Talk with your health care provider about how often you should have regular mammograms.  BRCA-related cancer screening. This may be done if you have a family history of breast, ovarian, tubal, or peritoneal cancers. Other tests  Sexually transmitted disease (STD) testing.  Bone density scan. This is done to screen for osteoporosis. You may have this done starting at age 79. Follow these instructions at home: Eating and drinking  Eat a diet that includes fresh fruits and vegetables, whole grains, lean protein, and low-fat dairy products. Limit your intake of foods with high amounts of sugar, saturated fats, and salt.  Take vitamin and mineral supplements as recommended by your health care provider.  Do not drink alcohol if your health care provider tells you not to drink.  If you drink alcohol: ? Limit how much you have to 0-1 drink a day. ? Be aware of how much alcohol is in your drink. In the [place] one drink equals one 12 oz bottle of beer (355 mL), one 5 oz glass of wine (148 mL), or one 1 oz glass of hard liquor (44 mL). Lifestyle  Take daily care of your teeth and gums.  Stay active. Exercise for at least 30 minutes on 5 or more days each week.  Do not use any products that contain nicotine or tobacco, such as  cigarettes, e-cigarettes, and chewing tobacco. If you need help quitting, ask your health care provider.  If you are sexually active, practice safe sex. Use a condom or other form of protection in order to prevent STIs (sexually transmitted infections).  Talk with your health care provider about taking a low-dose aspirin or statin. What's next?  Go to your health care provider once a year for a well check visit.  Ask your health care provider how often you should have your eyes and teeth checked.  Stay up to date on all vaccines. This information is not intended to replace advice given to you by your health care provider. Make sure you discuss any questions you have with your health care provider. Document Revised: 08/29/2018 Document Reviewed: 08/29/2018 Elsevier Patient Education  2020 [employer].

## 2020-06-23 ENCOUNTER — Ambulatory Visit (INDEPENDENT_AMBULATORY_CARE_PROVIDER_SITE_OTHER): Payer: Medicare Other | Admitting: Gastroenterology

## 2020-06-23 ENCOUNTER — Encounter: Payer: Self-pay | Admitting: Gastroenterology

## 2020-06-23 VITALS — BP 134/72 | HR 80 | Ht 64.0 in | Wt 174.0 lb

## 2020-06-23 DIAGNOSIS — K219 Gastro-esophageal reflux disease without esophagitis: Secondary | ICD-10-CM

## 2020-06-23 DIAGNOSIS — R197 Diarrhea, unspecified: Secondary | ICD-10-CM

## 2020-06-23 DIAGNOSIS — R142 Eructation: Secondary | ICD-10-CM | POA: Diagnosis not present

## 2020-06-23 NOTE — Patient Instructions (Addendum)
If you are age 72 or older, your body mass index should be between 23-30. Your Body mass index is 29.87 kg/m. If this is out of the aforementioned range listed, please consider follow up with your Primary Care Provider.  If you are age 64 or younger, your body mass index should be between 19-25. Your Body mass index is 29.87 kg/m. If this is out of the aformentioned range listed, please consider follow up with your Primary Care Provider.   Start a fiber supplement ( Benefiber, Citrucel)  Start probiotic Marine scientist).  See attached fiber chart.  Follow up as needed.  It was a pleasure to see you today!  Doristine Locks, D.O.  Fiber Chart  You should be consuming 25-30g of fiber per day and drinking 8 glasses of water to help your bowels move regularly.  In the chart below you can look up how much fiber you are getting in an average day.  If you are not getting enough fiber, you should add a fiber supplement to your diet.  Examples of this include Metamucil, FiberCon and Citrucel.  These can be purchased at your local grocery store or pharmacy.      [email or web address]

## 2020-06-23 NOTE — Progress Notes (Signed)
Chief Complaint: Diarrhea, abdominal pain   Referring Provider:     Esperanza Richters, PA-C   HPI:     Michele Maddox is a 74 y.o. female referred to the Gastroenterology Clinic for evaluation of change in bowel habits and abdominal pain.  Describes watery, nonbloody diarrhea which started July with associated lower abdominal pain and cramping.  Was seen by her PCM for this issue on 04/13/2020.  No preceding antibiotic exposure, changing diet, new medications, supplements, etc.  Evaluation otherwise unrevealing to include stool culture, O&P, C. difficile.  Was treated with Imodium (took briefly), high-fiber diet, probiotic containing food.  Due to ongoing intermittent symptoms, was referred to the GI clinic.  Today, states sxs have been intermittent since July, lasting approx 1 week at a time, described as loose, watery, non-bloody stools. Cramping improves with BM. 1 episode of fecal incontinence. +nocturnal sxs. Can have normal stools within these sxs. Last episode was 3 weeks ago.   Separately, long history of belching. Not worse lately, just curious about etiology. Has a hx of reflux (heartburn, regurgitation), o/w well controlled with omeprazole daily. No dysphagia. Not sure if she had an EGD in the past- none in EMR.  Endoscopic History: -Colonoscopy (approximately 2007, [place]): Normal per patient -Colonoscopy (11/2015, Dr. Rocky Crafts, [facility]): 10 mm hyperplastic polyp, sigmoid diverticulosis, internal hemorrhoids.  Recommend repeat 10 years.   Past Medical History:  Diagnosis Date  . Anemia   . Arthritis   . Depression   . GERD (gastroesophageal reflux disease)   . High cholesterol   . History of colon polyps   . History of kidney stones   . HOH (hard of hearing)    Deaf in right ear and implant in left  . Hypertension   . Pneumonia   . UTI (urinary tract infection)      Past Surgical History:  Procedure Laterality Date  . ABDOMINAL HYSTERECTOMY  1979   . COCHLEAR IMPLANT Left 2013  . COLONOSCOPY  03/03/2015   [facility]   Family History  Problem Relation Age of Onset  . Diabetes Father   . Breast cancer Maternal Grandmother   . Stomach cancer Maternal Grandfather   . Breast cancer Half-Sister        Mother side  . Diabetes Half-Brother        father side    Social History   Tobacco Use  . Smoking status: Former Smoker    Packs/day: 0.25    Years: 4.00    Pack years: 1.00    Quit date: 04/05/1982    Years since quitting: 38.2  . Smokeless tobacco: Never Used  Vaping Use  . Vaping Use: Never used  Substance Use Topics  . Alcohol use: Yes    Comment: describes extreme rare alcohol use. rare glass of wine.  . Drug use: No   Current Outpatient Medications  Medication Sig Dispense Refill  . calcium-vitamin D (OSCAL WITH D) 250-125 MG-UNIT tablet Take 1 tablet by mouth daily.    . cyclobenzaprine (FLEXERIL) 5 MG tablet 1 tab po q hs as needed neck pain 10 tablet 0  . Ferrous Sulfate (IRON PO) Take 1 tablet by mouth 2 (two) times daily. Poly Iron    . glycopyrrolate (ROBINUL) 2 MG tablet Take 2 mg by mouth 3 (three) times daily.    . Multiple Vitamins-Minerals (PRESERVISION AREDS PO) Take 2 tablets by mouth daily.    Marland Kitchen  omeprazole (PRILOSEC) 40 MG capsule Take 40 mg by mouth daily.    . Potassium Bicarb-Citric Acid (EFFER-K) 10 MEQ TBEF Take 1 tablet by mouth daily.     . pravastatin (PRAVACHOL) 20 MG tablet Take 20 mg by mouth daily.    Marland Kitchen triamterene-hydrochlorothiazide (DYAZIDE) 37.5-25 MG capsule Take 1 capsule by mouth daily.    . meloxicam (MOBIC) 15 MG tablet Take 15 mg by mouth daily.     No current facility-administered medications for this visit.   Allergies  Allergen Reactions  . Sulfa Antibiotics     As a child     Review of Systems: All systems reviewed and negative except where noted in HPI.     Physical Exam:    Wt Readings from Last 3 Encounters:  06/23/20 174 lb (78.9 kg)  05/19/20 170 lb  (77.1 kg)  04/13/20 179 lb 3.2 oz (81.3 kg)    BP 134/72   Pulse 80   Ht 5\' 4"  (1.626 m)   Wt 174 lb (78.9 kg)   BMI 29.87 kg/m  Constitutional:  Pleasant, in no acute distress. Psychiatric: Normal mood and affect. Behavior is normal. EENT: Pupils normal.  Conjunctivae are normal. No scleral icterus. Neck supple. No cervical LAD. Cardiovascular: Normal rate, regular rhythm. No edema Pulmonary/chest: Effort normal and breath sounds normal. No wheezing, rales or rhonchi. Abdominal: Soft, nondistended, nontender. Bowel sounds active throughout. There are no masses palpable. No hepatomegaly. Neurological: Alert and oriented to person place and time. Skin: Skin is warm and dry. No rashes noted.   ASSESSMENT AND PLAN;   1) Diarrhea -Acute onset diarrhea which seems to be improving by patient report.  Discussed conservative management vs additional lab testing and colonoscopy, and she strongly prefers the former -Increase dietary fiber.  Provided with fiber chart in detail by patient today -Start fiber supplement -Start probiotic such as -If symptoms ongoing, plan for colonoscopy with random and directed biopsies  2) Belching 3) History of reflux -Longstanding history of reflux which is generally well controlled with PPI.  Does have history of belching, which is not new and not worse lately.  Otherwise no alarm or "red flag" symptoms. -Discussed the indications, risks, benefits of EGD, and she strongly prefers to avoid EGD at this juncture -Can trial OTC simethicone -If symptoms ongoing or increasingly bothersome, plan for EGD to evaluate for erosive esophagitis, LES laxity, abdomen, etc.    RTC as needed   Librarian, academic, DO, FACG  06/23/2020, 2:16 PM   Saguier, 08/23/2020, PA-C

## 2020-07-08 ENCOUNTER — Ambulatory Visit (HOSPITAL_BASED_OUTPATIENT_CLINIC_OR_DEPARTMENT_OTHER): Payer: Medicare Other

## 2020-07-12 ENCOUNTER — Ambulatory Visit (HOSPITAL_BASED_OUTPATIENT_CLINIC_OR_DEPARTMENT_OTHER): Admission: RE | Admit: 2020-07-12 | Payer: Medicare Other | Source: Ambulatory Visit

## 2020-08-02 ENCOUNTER — Ambulatory Visit (HOSPITAL_BASED_OUTPATIENT_CLINIC_OR_DEPARTMENT_OTHER): Payer: Medicare Other

## 2020-08-04 ENCOUNTER — Encounter: Payer: Self-pay | Admitting: Medical

## 2020-08-04 MED ORDER — TRIAMTERENE-HCTZ 37.5-25 MG PO CAPS: 1.0000 | ORAL_CAPSULE | Freq: Every day | ORAL | 2 refills | Status: DC

## 2020-08-04 MED ORDER — OMEPRAZOLE 40 MG PO CPDR: 40.0000 mg | DELAYED_RELEASE_CAPSULE | Freq: Every day | ORAL | 2 refills | Status: AC

## 2020-08-09 ENCOUNTER — Inpatient Hospital Stay (HOSPITAL_BASED_OUTPATIENT_CLINIC_OR_DEPARTMENT_OTHER): Admission: RE | Admit: 2020-08-09 | Payer: Medicare Other | Source: Ambulatory Visit

## 2020-08-09 ENCOUNTER — Encounter (HOSPITAL_BASED_OUTPATIENT_CLINIC_OR_DEPARTMENT_OTHER): Payer: Self-pay

## 2020-08-09 ENCOUNTER — Ambulatory Visit (HOSPITAL_BASED_OUTPATIENT_CLINIC_OR_DEPARTMENT_OTHER)
Admission: RE | Admit: 2020-08-09 | Discharge: 2020-08-09 | Disposition: A | Discharge status: Home / Self Care | Payer: Medicare Other | Source: Ambulatory Visit | Attending: Medical | Admitting: Medical

## 2020-08-09 ENCOUNTER — Other Ambulatory Visit: Payer: Self-pay

## 2020-08-09 DIAGNOSIS — Z1231 Encounter for screening mammogram for malignant neoplasm of breast: Secondary | ICD-10-CM | POA: Diagnosis not present

## 2021-04-13 ENCOUNTER — Other Ambulatory Visit: Payer: Self-pay | Admitting: Medical

## 2022-02-25 IMAGING — MG DIGITAL SCREENING BILAT W/ TOMO W/ CAD
6 of 10 series · 6 of 30 positions shown · non-contrast
Comparison: Previous exam(s).

CLINICAL DATA: Screening.

EXAM:
DIGITAL SCREENING BILATERAL MAMMOGRAM WITH TOMO AND CAD

[L XCCL synth-2D]
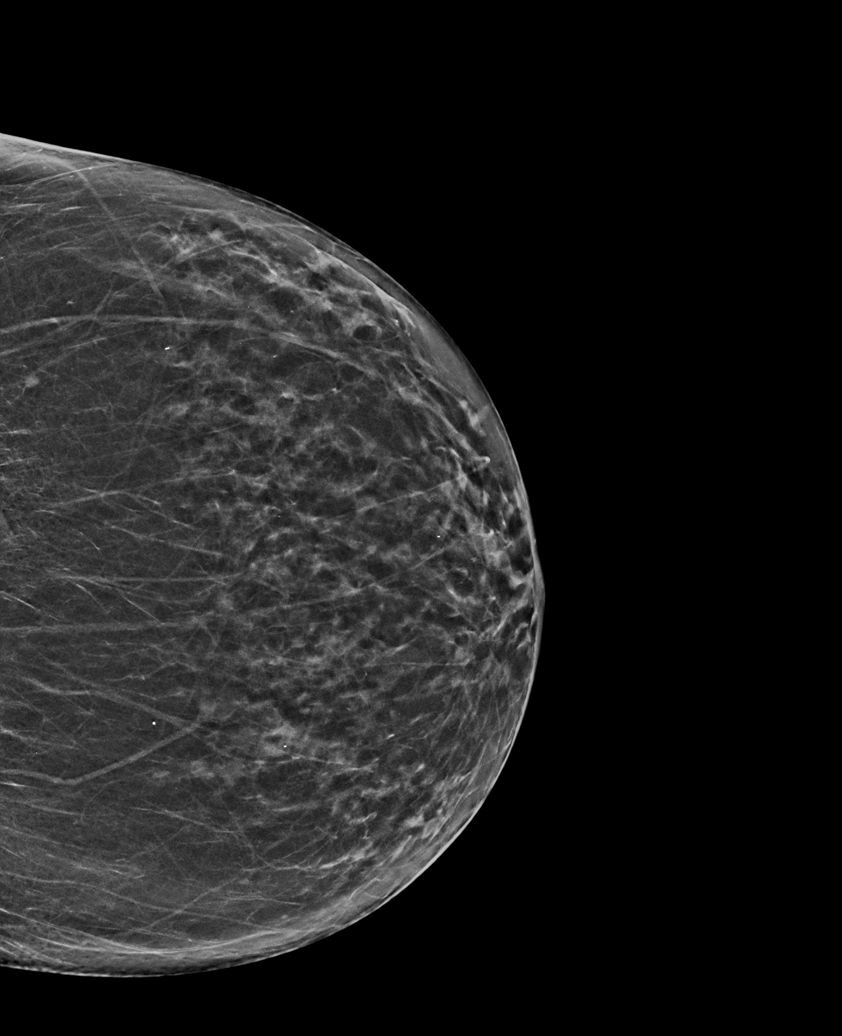

[R CC synth-2D]
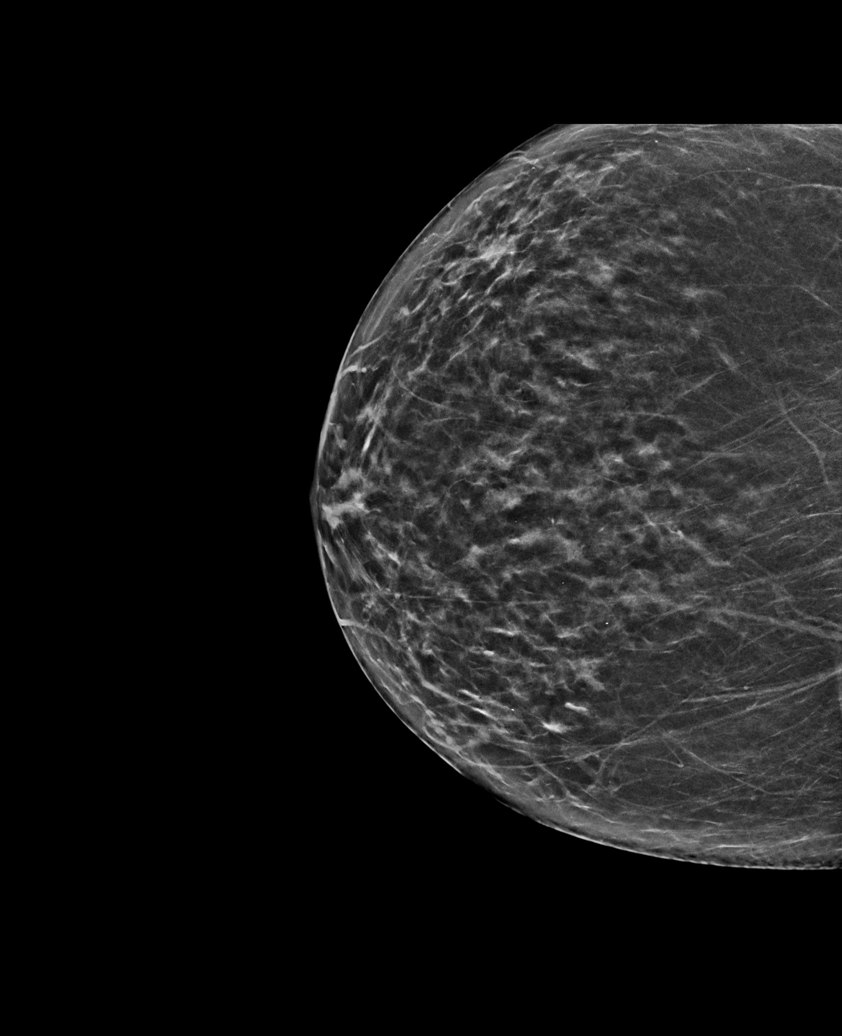

[L CC synth-2D]
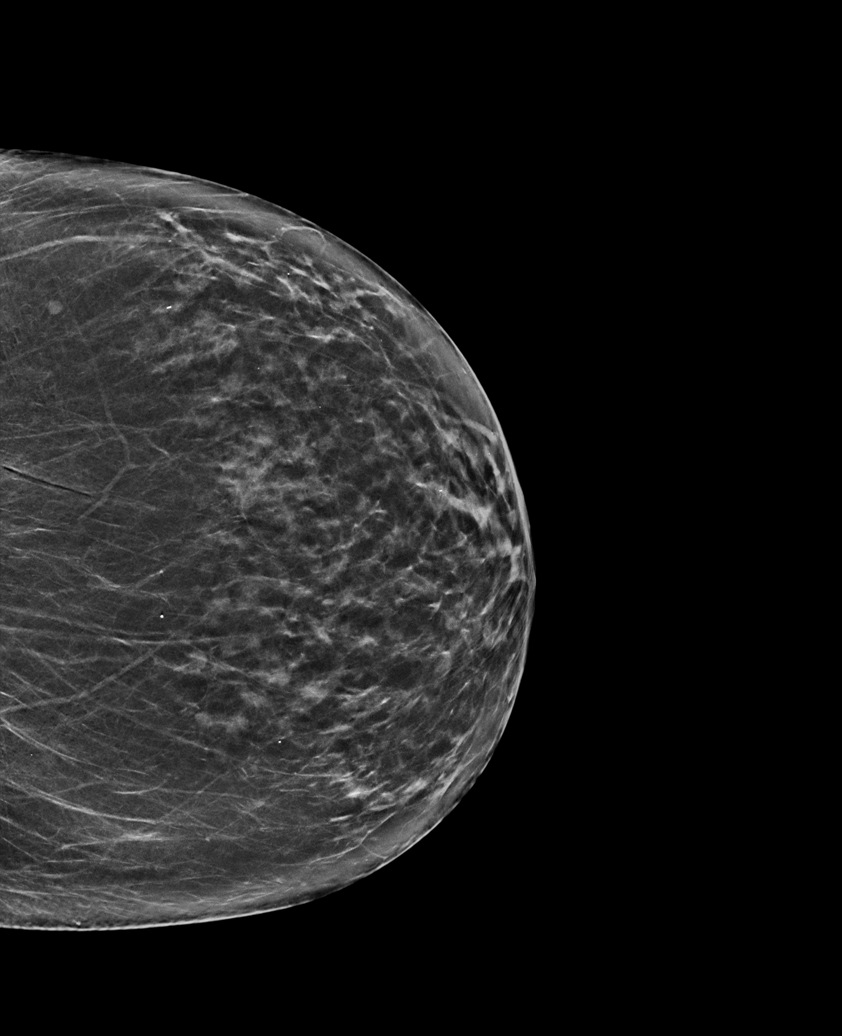

[L MLO synth-2D]
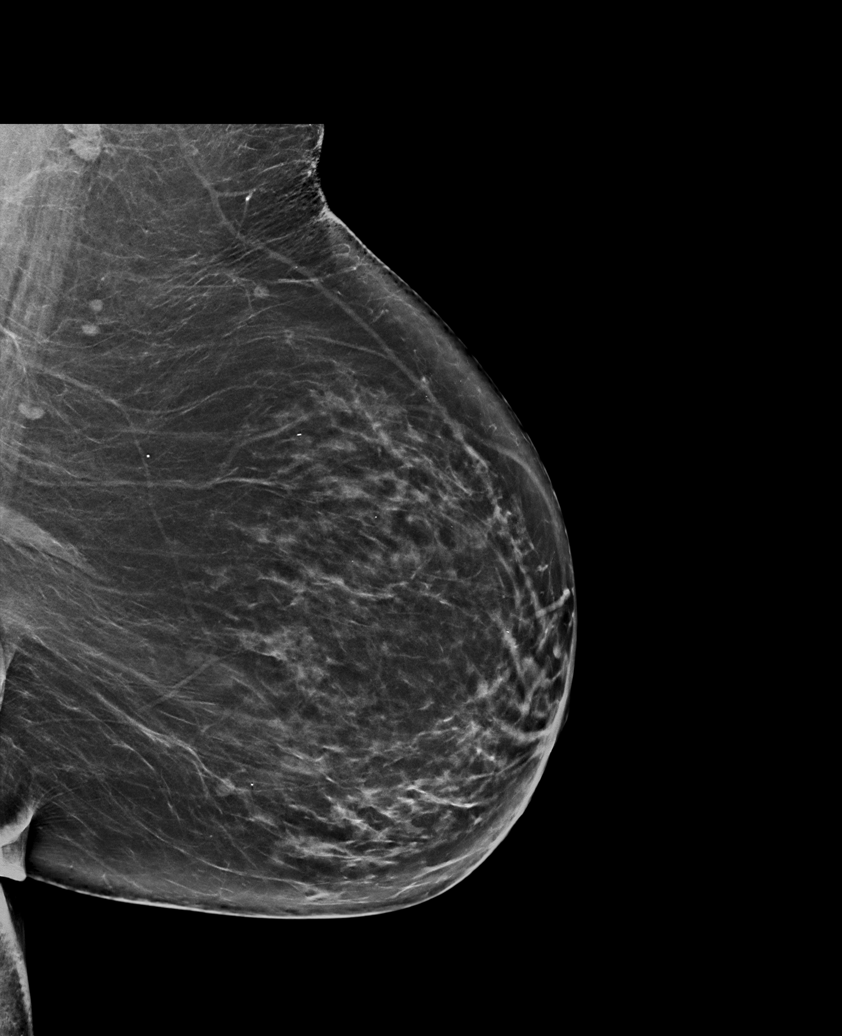

[R MLO synth-2D]
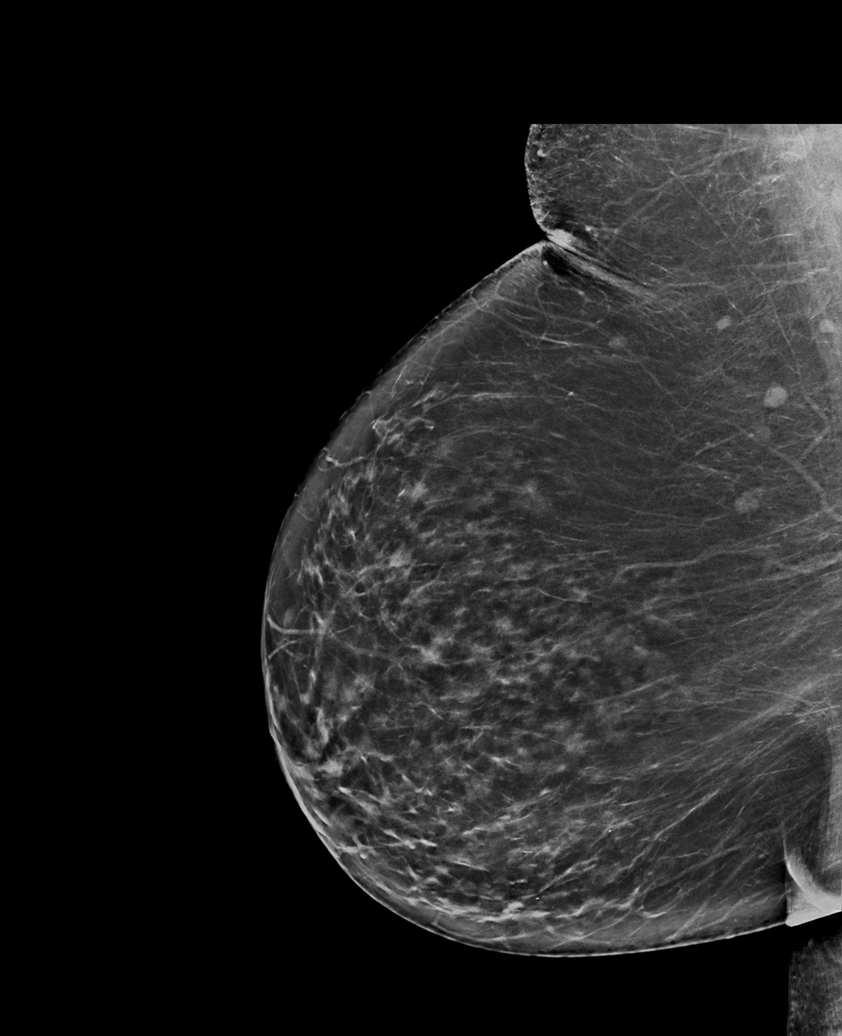

[L CC tomo · tomo slice 33/64.0]
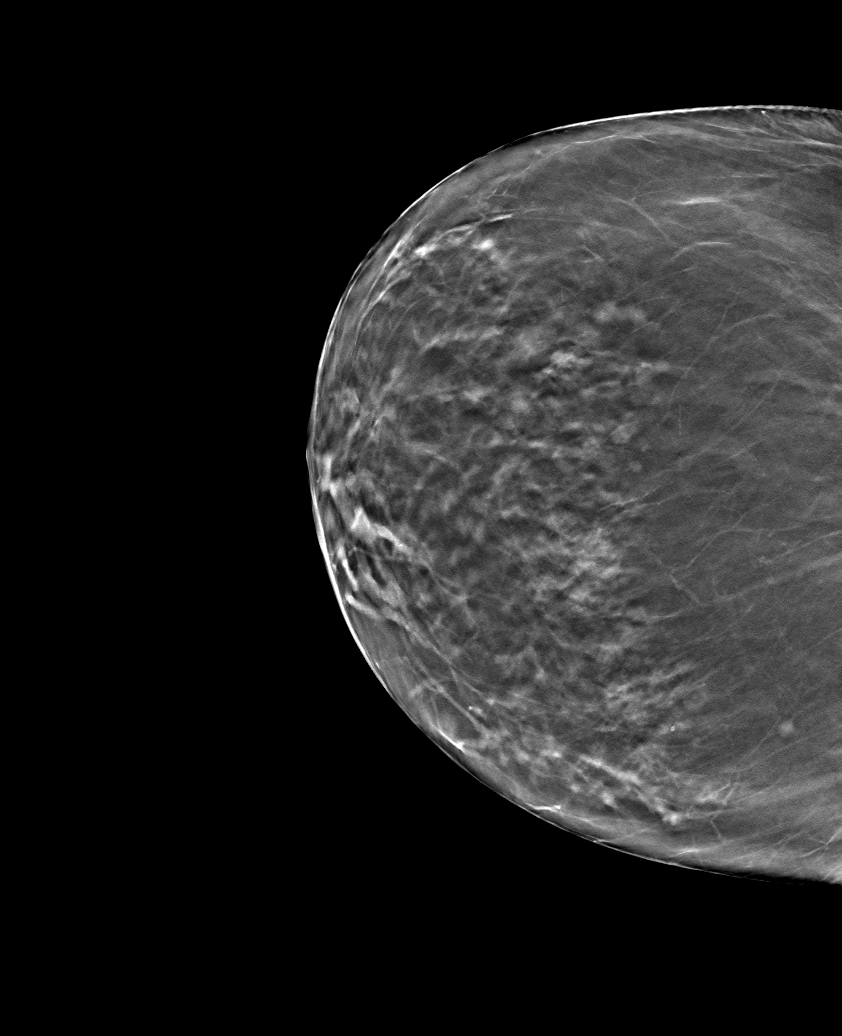

[6 of 30 positions shown; findings below may reference images not displayed]

ACR Breast Density Category b: There are scattered areas of
fibroglandular density.
FINDINGS: There are no findings suspicious for malignancy. Images were
processed with CAD.
IMPRESSION: No mammographic evidence of malignancy. A result letter of this
screening mammogram will be mailed directly to the patient.

RECOMMENDATION:
Screening mammogram in one year. (Code:[id number])

BI-RADS CATEGORY  1: Negative.
